# Patient Record
Sex: Female | Born: 1990 | Race: Black or African American | Hispanic: No | Marital: Single | Smoking: Never smoker
Health system: Southern US, Community
[De-identification: ages and names within clinical notes are randomized; demographics above are authoritative.]

## PROBLEM LIST (undated history)

## (undated) HISTORY — PX: WISDOM TOOTH EXTRACTION: SHX21

---
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
## 2010-01-31 ENCOUNTER — Ambulatory Visit: Payer: Self-pay | Admitting: Family Medicine

## 2010-01-31 ENCOUNTER — Encounter: Payer: Self-pay | Admitting: Family Medicine

## 2011-01-17 NOTE — Letter (Signed)
Summary: [facility] SPORT [facility]AM [facility]  [facility]   Imported By: Carin Primrose 02/01/2010 08:38:16  _____________________________________________________________________  External Attachment:    Type:   Image     Comment:   External Document

## 2011-01-17 NOTE — Assessment & Plan Note (Signed)
Summary: SPORTS PHYSICAL / LFW   Vital Signs:  Patient profile:   20 year old female Height:      64.25 inches Weight:      117.25 pounds BMI:     20.04 Temp:     97.4 degrees F oral Pulse rate:   68 / minute Pulse rhythm:   regular BP sitting:   104 / 68  (left arm) Cuff size:   regular  Vitals Entered By: Lewanda Rife LPN (January 31, 2010 12:57 PM) CC: sports exam for soccer  Vision Screening:Left eye with correction: 20 / 25 Right eye with correction: 20 / 20 Both eyes with correction: 20 / 20        Vision Entered By: Lewanda Rife LPN (January 31, 2010 12:58 PM)   History of Present Illness: starting school soccer soon  in good shape - playing indoor soccer all winter   is senior in [employer]   - and is grad soon  was accepted into several colleges   in past injured both wrists - broken both separately  foot fx years ago- all healed up with no problems   no sudden death t in family and no hx of heart M  Allergies (verified): No Known Drug Allergies  Review of Systems General:  Denies fatigue, fever, loss of appetite, and malaise. Eyes:  Denies blurring, discharge, double vision, and eye irritation. CV:  Denies chest pain or discomfort, leg cramps with exertion, lightheadness, near fainting, palpitations, and shortness of breath with exertion. Resp:  Denies cough, pleuritic, shortness of breath, and wheezing. GI:  Denies abdominal pain, bloody stools, change in bowel habits, and indigestion. MS:  Denies joint pain, joint redness, joint swelling, cramps, muscle weakness, and stiffness. Derm:  Denies lesion(s), poor wound healing, and rash. Neuro:  Denies numbness and tingling. Endo:  Denies excessive thirst. Heme:  Denies abnormal bruising and bleeding.  Physical Exam  General:  well developed, well nourished, in no acute distress Head:  normocephalic, atraumatic, and no abnormalities observed.   Eyes:  vision grossly intact, pupils equal, pupils round, and  pupils reactive to light.   Ears:  L ear normal.   Mouth:  pharynx pink and moist.   Neck:   no cervical or supraclavicular lymphadenopathy  Lungs:  clear bilaterally to A & P Heart:  RRR without murmur Abdomen:  Bowel sounds positive,abdomen soft and non-tender without masses, organomegaly or hernias noted. Msk:  No deformity or scoliosis noted of thoracic or lumbar spine.   nl rom all limbs ped planus bunion medial R foot  Pulses:  R and L carotid,radial,femoral,dorsalis pedis and posterior tibial pulses are full and equal bilaterally Extremities:  No clubbing, cyanosis, edema, or deformity noted with normal full range of motion of all joints.   Neurologic:  sensation intact to light touch, gait normal, and DTRs symmetrical and normal.   Skin:  Intact without suspicious lesions or rashes Cervical Nodes:  No lymphadenopathy noted Psych:  normal affect, talkative and pleasant    Impression & Recommendations:  Problem # 1:  ATHLETIC PHYSICAL, NORMAL (ICD-V70.3) Assessment Comment Only  no restrictions disc athletic prep and safety/ imp of hydration and balanced diet with good calcium intake  disc school/peer issues and upcoming college  Orders: Sports Physical (Sport)  Patient Instructions: 1)  keep a balanced diet  2)  good stretching and hydration to avoid injury 3)  no restricitons for sports   Prior Medications (reviewed today): None Current Allergies (reviewed today): No  known allergies

## 2012-04-29 ENCOUNTER — Encounter: Payer: Self-pay | Admitting: Obstetrics and Gynecology

## 2012-04-29 ENCOUNTER — Ambulatory Visit (INDEPENDENT_AMBULATORY_CARE_PROVIDER_SITE_OTHER): Payer: BC Managed Care – HMO | Admitting: Obstetrics and Gynecology

## 2012-04-29 VITALS — BP 100/64 | Temp 98.7°F | Ht 65.5 in | Wt 122.0 lb

## 2012-04-29 DIAGNOSIS — Z01419 Encounter for gynecological examination (general) (routine) without abnormal findings: Secondary | ICD-10-CM

## 2012-04-29 MED ORDER — NORETHIN ACE-ETH ESTRAD-FE 1-20 MG-MCG PO TABS: 1.0000 | ORAL_TABLET | Freq: Every day | ORAL | Status: DC

## 2012-04-29 NOTE — Progress Notes (Signed)
Last Pap: None WNL: No Regular Periods:no Contraception: Pill  Monthly Breast exam:no Tetanus<4yrs:yes Nl.Bladder Function:Yes Daily BMs:yes Healthy Diet:yes Calcium:no Mammogram:no Exercise:yes Seatbelt: yes Abuse at home: no Stressful work:no Sigmoid-colonoscopy: No Bone Density: No

## 2012-04-29 NOTE — Progress Notes (Signed)
Subjective:    Diane Prince is a 21 y.o. female, G0P0, who presents for an annual exam. The patient reports no complaints.  Menstrual cycle:   LMP: Patient's last menstrual period was 04/01/2012.           flow is light and Cycle is monthly with normal flow                                  and without intermenstrual bleeding or severe dysmenorrhea  Review of Systems Pertinent items are noted in HPI. Denies pelvic pain, uti symptoms, vaginitis symptoms, irregular bleeding, menopausal symptoms, change in bowel habits or rectal bleeding   Objective:    BP 100/64  Temp 98.7 F (37.1 C)  Ht 5' 5.5" (1.664 m)  Wt 122 lb (55.339 kg)  BMI 19.99 kg/m2  LMP 04/01/2012   :  Wt Readings from Last 1 Encounters:  04/29/12 122 lb (55.339 kg)   B: Body mass index is 19.99 kg/(m^2). General Appearance: Alert, appropriate appearance for age. No acute distress HEENT: Grossly normal Neck / Thyroid: Supple, no thyromegaly or cervical adenopathy Lungs: clear to auscultation bilaterally Back: No CVA tenderness Breast Exam: No masses or nodes.No dimpling, nipple retraction or discharge. Taught  self breast exam Cardiovascular: Regular rate and rhythm.  Gastrointestinal: Soft, non-tender, no masses or organomegaly Pelvic Exam: EGBUS-wnl, uterus appears normal size shape and consistency, adnexae-no masses or tenderness Lymphatic Exam: Non-palpable nodes in neck, clavicular,  axillary, or inguinal regions  Skin: no rashes or abnormalities Extremities: no clubbing cyanosis or edema  Neurologic: grossly normal Psychiatric: Alert and oriented, appropriate affect.    Assessment:   Routine GYN Exam Contraceptive Refill Plan:  Junel 1/20 #1  1 po qd 11 refills BCP instructions  Remijio Holleran,ELMIRAPA-C

## 2012-07-31 ENCOUNTER — Ambulatory Visit (INDEPENDENT_AMBULATORY_CARE_PROVIDER_SITE_OTHER): Payer: BC Managed Care – HMO | Admitting: Family Medicine

## 2012-07-31 ENCOUNTER — Encounter: Payer: Self-pay | Admitting: Family Medicine

## 2012-07-31 VITALS — BP 100/70 | HR 90 | Temp 97.7°F | Ht 65.5 in | Wt 127.5 lb

## 2012-07-31 DIAGNOSIS — R300 Dysuria: Secondary | ICD-10-CM

## 2012-07-31 LAB — POCT URINALYSIS DIPSTICK
Ketones, UA: NEGATIVE
Leukocytes, UA: NEGATIVE
Nitrite, UA: NEGATIVE
pH, UA: 7

## 2012-07-31 MED ORDER — SULFAMETHOXAZOLE-TRIMETHOPRIM 800-160 MG PO TABS: 1.0000 | ORAL_TABLET | Freq: Two times a day (BID) | ORAL | Status: DC

## 2012-07-31 NOTE — Progress Notes (Signed)
   [facility] [address] [place] [address] Phone: [phone] Fax: [phone]  Date:  07/31/2012   Name:  Diane Prince   DOB:  [birth year]   MRN:  [id number] Gender: female  Age: 21 y.o.  PCP:  Kerby Nora, MD    Chief Complaint: Urinary Tract Infection  Patient presents with urgency, small amt blood in urine No vaginal discharge or external irritation.  No STD exposure. No abd pain, no flank pain. Feels like needs to go  No burning No pain Was some blood  LMP 1 1/2 weeks ago   ROS: GEN:  no fevers, chills. GI: No n/v/d, eating normally Otherwise, ROS is as per the HPI.  PHYSICAL EXAM  Blood pressure 100/70, pulse 90, temperature 97.7 F (36.5 C), temperature source Oral, height 5' 5.5" (1.664 m), weight 127 lb 8 oz (57.834 kg), SpO2 98.00%.  GEN: WDWN, A&Ox4,NAD. Non-toxic HEENT: Atraumatc, normocephalic. CV: RRR, No M/G/R PULM: CTA B, No wheezes, crackles, or rhonchi ABD: S, NT, ND, +BS, no rebound. No CVAT. No suprapubic tenderness. EXT: No c/c/e  A/P: UTI. Rx with ABX as below   Outpatient Encounter Prescriptions as of 07/31/2012  Medication Sig Dispense Refill  . norethindrone-ethinyl estradiol (JUNEL FE 1/20) 1-20 MG-MCG tablet Take 1 tablet by mouth daily.  1 Package  11  . sulfamethoxazole-trimethoprim (BACTRIM DS,SEPTRA DS) 800-160 MG per tablet Take 1 tablet by mouth 2 (two) times daily.  6 tablet  0

## 2012-08-02 ENCOUNTER — Inpatient Hospital Stay (HOSPITAL_COMMUNITY)
Admission: EM | Admit: 2012-08-02 | Discharge: 2012-08-04 | DRG: 541 | Disposition: A | Discharge status: Home / Self Care | Payer: BC Managed Care – PPO | Attending: Internal Medicine | Admitting: Internal Medicine

## 2012-08-02 ENCOUNTER — Emergency Department (HOSPITAL_COMMUNITY): Payer: BC Managed Care – PPO

## 2012-08-02 ENCOUNTER — Encounter (HOSPITAL_COMMUNITY): Payer: Self-pay | Admitting: Family Medicine

## 2012-08-02 ENCOUNTER — Telehealth: Payer: Self-pay

## 2012-08-02 ENCOUNTER — Inpatient Hospital Stay (HOSPITAL_COMMUNITY): Payer: BC Managed Care – PPO

## 2012-08-02 DIAGNOSIS — I369 Nonrheumatic tricuspid valve disorder, unspecified: Secondary | ICD-10-CM

## 2012-08-02 DIAGNOSIS — H00029 Hordeolum internum unspecified eye, unspecified eyelid: Secondary | ICD-10-CM

## 2012-08-02 DIAGNOSIS — N201 Calculus of ureter: Secondary | ICD-10-CM | POA: Diagnosis present

## 2012-08-02 DIAGNOSIS — N390 Urinary tract infection, site not specified: Secondary | ICD-10-CM | POA: Diagnosis present

## 2012-08-02 DIAGNOSIS — J9383 Other pneumothorax: Secondary | ICD-10-CM | POA: Diagnosis present

## 2012-08-02 DIAGNOSIS — R079 Chest pain, unspecified: Secondary | ICD-10-CM | POA: Diagnosis present

## 2012-08-02 DIAGNOSIS — M79609 Pain in unspecified limb: Secondary | ICD-10-CM

## 2012-08-02 DIAGNOSIS — J982 Interstitial emphysema: Principal | ICD-10-CM | POA: Diagnosis present

## 2012-08-02 DIAGNOSIS — N1330 Unspecified hydronephrosis: Secondary | ICD-10-CM | POA: Diagnosis present

## 2012-08-02 LAB — CBC
Hemoglobin: 14.2 g/dL (ref 12.0–15.0)
MCH: 30.4 pg (ref 26.0–34.0)
RBC: 4.67 MIL/uL (ref 3.87–5.11)

## 2012-08-02 LAB — POCT I-STAT, CHEM 8
BUN: 16 mg/dL (ref 6–23)
Creatinine, Ser: 0.9 mg/dL (ref 0.50–1.10)
Potassium: 3.5 mEq/L (ref 3.5–5.1)
Sodium: 139 mEq/L (ref 135–145)
TCO2: 20 mmol/L (ref 0–100)

## 2012-08-02 LAB — CARDIAC PANEL(CRET KIN+CKTOT+MB+TROPI)
CK, MB: 1.6 ng/mL (ref 0.3–4.0)
CK, MB: 1.5 ng/mL (ref 0.3–4.0)
Relative Index: INVALID (ref 0.0–2.5)
Total CK: 92 U/L (ref 7–177)
Troponin I: <0.3 ng/mL (ref <0.30)

## 2012-08-02 LAB — MRSA PCR SCREENING: MRSA by PCR: NEGATIVE

## 2012-08-02 LAB — HEPATIC FUNCTION PANEL
Indirect Bilirubin: 0.7 mg/dL (ref 0.3–0.9)
Total Protein: 7.1 g/dL (ref 6.0–8.3)

## 2012-08-02 LAB — RAPID URINE DRUG SCREEN, HOSP PERFORMED
Cocaine: NOT DETECTED
Opiates: NOT DETECTED

## 2012-08-02 LAB — TSH: TSH: 0.165 u[IU]/mL — ABNORMAL LOW (ref 0.350–4.500)

## 2012-08-02 MED ORDER — IOHEXOL 350 MG/ML SOLN
100.0000 mL | Freq: Once | INTRAVENOUS | Status: AC | PRN
  Administered 2012-08-02: 100 mL via INTRAVENOUS

## 2012-08-02 MED ORDER — BIOTENE DRY MOUTH MT LIQD
15.0000 mL | Freq: Two times a day (BID) | OROMUCOSAL | Status: DC
  Administered 2012-08-02 (×2): 15 mL via OROMUCOSAL

## 2012-08-02 MED ORDER — SODIUM CHLORIDE 0.9 % IJ SOLN
3.0000 mL | Freq: Two times a day (BID) | INTRAMUSCULAR | Status: DC
  Administered 2012-08-02 – 2012-08-03 (×2): 3 mL via INTRAVENOUS

## 2012-08-02 MED ORDER — SODIUM CHLORIDE 0.9 % IV SOLN
INTRAVENOUS | Status: DC
  Administered 2012-08-02: 07:00:00 via INTRAVENOUS

## 2012-08-02 MED ORDER — PIPERACILLIN-TAZOBACTAM 3.375 G IVPB
3.3750 g | Freq: Three times a day (TID) | INTRAVENOUS | Status: DC
  Administered 2012-08-02: 3.375 g via INTRAVENOUS
  Filled 2012-08-02 (×3): qty 50

## 2012-08-02 NOTE — Telephone Encounter (Signed)
Noted  

## 2012-08-02 NOTE — Progress Notes (Signed)
Patient was admitted few hours ago by my partner Dr. Pearson Grippe for incidental finding of pneumomediastinum on CT scan, was done for some atypical chest pain in the ER.  Pulmonary critical care has already been officially consulted they will be following the patient, I have also discussed the case with cardiothoracic surgeon on call Dr. Lavinia Sharps earlier this morning, recommends just monitoring the patient for 24 hours, if she is symptom-free to discharge her home. He has nothing to offer. He recommended no dietary restrictions whatsoever.  She is on Zosyn for her UTI and pneumomediastinum for now.

## 2012-08-02 NOTE — Consult Note (Signed)
Name: Diane Prince MRN: [id number] DOB: 03/07/1991    LOS: 0    History of Present Illness:  21 yo female with recent trip to [place], Just returned about 57month ago, has had recent uti diagnosis on 8/14,  tx with bactrim ds. Pt developed sharp chest pain with yawning or inhaling air too quickly and slight sob on . Pain radiated to her neck. Slight nausea. + cough on 8/15,  nonproductive. Denied fever, chills, abd pain, diarrhea, brbpr, black stool. No hx of trauma. No wretching. No hx of asthma. ER evaluation showed clear CXR, but CT chest obtained d/t positive d-dimer showed Pneumomediastinum which was the reason for our consult.    Cultures: 8/16 rapid strep screen: negative mrsa PCR 8/16>>>  Antibiotics: Zosyn 8/16 (?)>>>  Tests / Events: CT chest 8/16: . Pneumomediastinum of unknown etiology. No esophageal inflammatory changes. The patient has a history of recent air travel, raising the possibility of barotrauma.  2. Negative for pulmonary embolism or acute vascular abnormality. UDS 8/16>>> Esophagram 8/16>>>  History reviewed. No pertinent past medical history. Past Surgical History  Procedure Date  . Wisdom tooth extraction    Prior to Admission medications   Medication Sig Start Date End Date Taking? Authorizing Provider  ibuprofen (ADVIL,MOTRIN) 200 MG tablet Take 400 mg by mouth every 8 (eight) hours as needed. For pain   Yes Historical Provider, MD  norethindrone-ethinyl estradiol (JUNEL FE,GILDESS FE,LOESTRIN FE) 1-20 MG-MCG tablet Take 1 tablet by mouth daily.   Yes Historical Provider, MD  sulfamethoxazole-trimethoprim (BACTRIM DS,SEPTRA DS) 800-160 MG per tablet Take 1 tablet by mouth 2 (two) times daily. For 3 days. Started 07/31/12.   Yes Historical Provider, MD   Allergies No Known Allergies  Family History Family History  Problem Relation Age of Onset  . Diabetes Paternal Grandfather   . Cancer Maternal Grandfather     Social History  reports that she has never smoked. She has never used smokeless tobacco. She reports that she does not drink alcohol or use illicit drugs. Student at [employer].  Review Of Systems  11 points review of systems is negative with an exception of listed in HPI. Denies nausea, choking, retching, cough, trauma, inhalants, asthma or any pert positives.   Vital Signs: BP 107/58  Pulse 69  Temp 97.8 F (36.6 C) (Oral)  Resp 24  SpO2 100%  LMP 07/19/2012 Room air      . sodium chloride 75 mL/hr at 08/02/12 0705     Intake/Output Summary (Last 24 hours) at 08/02/12 0841 Last data filed at 08/02/12 0800  Gross per 24 hour  Intake  68.75 ml  Output      0 ml  Net  68.75 ml    Physical Examination: General:  Well appearing well developed 21 year old female in no acute distress. Reporting pain subsided substantially since earlier this am Neuro:  Awake and oriented HEENT:  , no JVD Cardiovascular:  rrr Lungs:  CTA Abdomen:  Non-tender Musculoskeletal:  intact Skin:  intact  Ventilator settings:    Labs and Imaging:   Lab 08/02/12 0244  NA 139  K 3.5  CL 107  CO2 --  BUN 16  CREATININE 0.90  GLUCOSE 90    Lab 08/02/12 0244 08/02/12 0235  HGB 14.6 14.2  HCT 43.0 40.4  WBC -- 13.9*  PLT -- 240    Assessment and Plan: Spontaneous Pneumomediastinum. No clear etiology. Denies asthma, coughing, nausea, vomiting, choking or drug  inhalation. Had recent air travel but this was over two weeks ago.  Plan Check UDS Check esophagram to r/o esophageal injury F/u am cxr Prn analgesia D/c zosyn if esophogram is negative If pain stable can be d/c in am Would limit heavy lifting and air travel for at least 4-6 weeks.    BABCOCK,PETE 08/02/2012, 8:41 AM  Reviewed above, examined pt, and agree with assessment/plan.  Minimal symptoms of chest discomfort, benign physical exam, and maintaining oxygenation on room air.  No clear explanation for pneumomediastinum.  Will check  UDS, esophogram and f/u CXR.  Agree with travel restrictions and avoiding any lifting for 6 weeks at minimum.  Updated mother at bedside about plan.  Coralyn Helling, MD [facility] Pulmonary/Critical Care 08/02/2012, 10:18 AM Pager:  [phone] After 3pm call: [phone]

## 2012-08-02 NOTE — Telephone Encounter (Signed)
Triage Record Num: [id number] Operator: Alphonsa Overall Patient Name: Diane Prince Call Date & Time: 08/02/2012 1:17:31AM Patient Phone: [phone] PCP: Hannah Beat Patient Gender: Female PCP Fax : [phone] Patient DOB: 12/24/1990 Practice Name: [facility] Reason for Call: Caller: [place]/Mother; PCP: Juleen China.; CB#: [phone]; Call regarding Medication reaction Mom calling about chest tightening. Onset 08/01/12 at 1200. Began new medication Sulfamethoxazole 07/31/12 for UTI x 4 doses-improving. Afebrile. Shortness of breath. Symptoms worsening. Coughing. Breathing symptoms worsening over last few hours. Care advice given. Mom aware pt needs ED evaluation now. [place]. Protocol(s) Used: Breathing Problems Recommended Outcome per Protocol: See ED Immediately Reason for Outcome: New or worsening breathing problems that have not been evaluated Care Advice: ~ Another adult should drive. Call EMS 911 if develop new onset or increasing confusion or lethargy; breathing problems continue to worsen or skin becomes bluish/gray; develops chest pain. ~ ~ IMMEDIATE ACTION Write down provider's name. List or place the following in a bag for transport with the patient: current prescription and/or nonprescription medications; alternative treatments, therapies and medications; and street drugs. ~ May have clear liquids (such as water, clear fruit juices without pulp, soda, tea or coffee without dairy or non-dairy creamer, clear broth or bouillon, oral hydration solution, or plain gelatin, fruit ices/popsicles, hard candy) but do not eat solid foods before being seen by your provider. ~ ~ Place person in a position of comfort and loosen tight clothing. 08/02/2012 1:30:06AM Page 1 of 1

## 2012-08-02 NOTE — H&P (Signed)
Diane Prince is an 21 y.o. female.    Pcp:  [facility]  Chief Complaint: chest pain HPI: 21 yo female with recent trip to [place],  Just returned about 39month ago, has had recent uti diagnosis on Wednesday, tx with bactrim ds.  Pt developed sscp "sharp" pain with yawning or inhaling air too quickly and slight sob.  Pain radiated to her neck. Slight nausea.  + cough yesterday, nonproductive.   Pt denies fever, chills, abd pain, diarrhea, brbpr, black stool. No hx of trauma. No wretching. No hx of asthma.    In ED, CXR was negative.  D-dimer + and CT scan chest showed evidence of pneumomediastinum. I have contacted critical care who asked that we admit the patient to Stepdown.    History reviewed. No pertinent past medical history.  Past Surgical History  Procedure Date  . Wisdom tooth extraction     Family History  Problem Relation Age of Onset  . Diabetes Paternal Grandfather   . Cancer Maternal Grandfather    Social History:  reports that she has never smoked. She has never used smokeless tobacco. She reports that she does not drink alcohol or use illicit drugs.  Allergies: No Known Allergies   (Not in a hospital admission)  Results for orders placed during the hospital encounter of 08/02/12 (from the past 48 hour(s))  RAPID STREP SCREEN     Status: Normal   Collection Time   08/02/12  2:16 AM      Component Value Range Comment   Streptococcus, Group A Screen (Direct) NEGATIVE  NEGATIVE   D-DIMER, QUANTITATIVE     Status: Abnormal   Collection Time   08/02/12  2:35 AM      Component Value Range Comment   D-Dimer, Quant 0.72 (*) 0.00 - 0.48 ug/mL-FEU   CBC     Status: Abnormal   Collection Time   08/02/12  2:35 AM      Component Value Range Comment   WBC 13.9 (*) 4.0 - 10.5 K/uL    RBC 4.67  3.87 - 5.11 MIL/uL    Hemoglobin 14.2  12.0 - 15.0 g/dL    HCT 40.9  81.1 - 91.4 %    MCV 86.5  78.0 - 100.0 fL    MCH 30.4  26.0 - 34.0 pg    MCHC 35.1  30.0 - 36.0  g/dL    RDW 78.2  95.6 - 21.3 %    Platelets 240  150 - 400 K/uL   POCT I-STAT, CHEM 8     Status: Normal   Collection Time   08/02/12  2:44 AM      Component Value Range Comment   Sodium 139  135 - 145 mEq/L    Potassium 3.5  3.5 - 5.1 mEq/L    Chloride 107  96 - 112 mEq/L    BUN 16  6 - 23 mg/dL    Creatinine, Ser 0.86  0.50 - 1.10 mg/dL    Glucose, Bld 90  70 - 99 mg/dL    Calcium, Ion 5.78  4.69 - 1.23 mmol/L    TCO2 20  0 - 100 mmol/L    Hemoglobin 14.6  12.0 - 15.0 g/dL    HCT 62.9  52.8 - 41.3 %    Dg Chest 2 View  08/02/2012  *RADIOLOGY REPORT*  Clinical Data: Short of breath.  CHEST - 2 VIEW  Comparison: None.  Findings:  Cardiopericardial silhouette within normal limits. Mediastinal contours normal.  Trachea midline.  No airspace disease or effusion. Monitoring leads are projected over the chest.  IMPRESSION: Negative two-view chest.  Original Report Authenticated By: Andreas Newport, M.D.   Ct Angio Chest Pe W/cm &/or Wo Cm  08/02/2012  *RADIOLOGY REPORT*  Clinical Data: Short of breath and pain with inspiration.  Elevated white blood cell count.  CT ANGIOGRAPHY CHEST  Technique:  Multidetector CT imaging of the chest using the standard protocol during bolus administration of intravenous contrast. Multiplanar reconstructed images including MIPs were obtained and reviewed to evaluate the vascular anatomy.  Contrast: OMNIPAQUE IOHEXOL 350 MG/ML SOLN  Comparison: Chest radiograph 08/02/2012.  Findings: Technically adequate study without pulmonary embolus. The heart appears normal.  Pneumomediastinum is present.  There is no pneumothorax.  Pneumomediastinum is diffusely distributed, both anterior and posterior.  No mediastinum is present around the thymus.  There is no definite esophageal thickening.  No pleural fluid.  Soft tissue emphysema extends into the neck.  Bones appear within normal limits.  IMPRESSION: 1.  Pneumomediastinum of unknown etiology.  No esophageal inflammatory  changes.  The patient has a history of recent air travel, raising the possibility of barotrauma. 2.  Negative for pulmonary embolism or acute vascular abnormality. 3. Critical Value/emergent results were called by telephone at the time of interpretation on 08/02/2012 at 1658 hours to OPITZ, BRIAN, who verbally acknowledged these results.  Original Report Authenticated By: Andreas Newport, M.D.    Review of Systems  Constitutional: Negative for fever, chills, weight loss, malaise/fatigue and diaphoresis.  HENT: Negative for hearing loss, ear pain, nosebleeds, congestion, neck pain, tinnitus and ear discharge.   Eyes: Negative for blurred vision, double vision, photophobia, pain, discharge and redness.  Respiratory: Positive for cough and shortness of breath. Negative for hemoptysis, sputum production, wheezing and stridor.   Cardiovascular: Positive for chest pain. Negative for palpitations, orthopnea, claudication and leg swelling.  Gastrointestinal: Positive for nausea. Negative for heartburn, vomiting, abdominal pain, diarrhea, constipation, blood in stool and melena.  Genitourinary: Negative for dysuria, urgency, frequency, hematuria and flank pain.  Musculoskeletal: Negative for myalgias, back pain, joint pain and falls.  Skin: Negative for itching and rash.  Neurological: Positive for headaches. Negative for dizziness, tingling, tremors, sensory change, speech change, focal weakness, seizures and loss of consciousness.  Endo/Heme/Allergies: Negative for environmental allergies and polydipsia. Does not bruise/bleed easily.  Psychiatric/Behavioral: Negative for depression, suicidal ideas, hallucinations, memory loss and substance abuse. The patient is not nervous/anxious and does not have insomnia.     Blood pressure 114/66, pulse 91, temperature 97.8 F (36.6 C), temperature source Oral, resp. rate 24, last menstrual period 07/19/2012, SpO2 100.00%. Physical Exam  Constitutional: She is  oriented to person, place, and time. She appears well-developed and well-nourished.  HENT:  Head: Normocephalic and atraumatic.  Mouth/Throat: No oropharyngeal exudate.  Eyes: Conjunctivae and EOM are normal. Pupils are equal, round, and reactive to light. Right eye exhibits no discharge. Left eye exhibits no discharge. No scleral icterus.  Neck: Normal range of motion. Neck supple. No JVD present. No tracheal deviation present. No thyromegaly present.  Cardiovascular: Normal rate, regular rhythm and normal heart sounds.  Exam reveals no gallop and no friction rub.   No murmur heard. Respiratory: Effort normal and breath sounds normal. No stridor. No respiratory distress. She has no wheezes. She has no rales. She exhibits no tenderness.       Slight decrease in bs in right upper lung  GI: Soft. Bowel sounds are normal. She exhibits  no distension and no mass. There is no tenderness. There is no rebound and no guarding.  Musculoskeletal: Normal range of motion. She exhibits no edema and no tenderness.  Lymphadenopathy:    She has no cervical adenopathy.  Neurological: She is alert and oriented to person, place, and time. She has normal reflexes. She displays normal reflexes. No cranial nerve deficit. She exhibits normal muscle tone.  Skin: Skin is warm and dry. No rash noted. No erythema. No pallor.       No crepitus, over neck or chest, though the right side of neck appears slightly fuller than the left.      Assessment/Plan Cp likely secondary to pneumomediastinum Cycle cardiac markers Check echo  Pneumomediastium Nothing to suggest esophageal pathology such as wretching Pulm/critical care consulted Zosyn empirically   Uti: zosyn DVT prophylaxis: scd   Pearson Grippe 08/02/2012, 5:54 AM

## 2012-08-02 NOTE — Progress Notes (Signed)
  Echocardiogram 2D Echocardiogram has been performed.  Amauria Younts 08/02/2012, 9:12 AM

## 2012-08-02 NOTE — ED Provider Notes (Signed)
History     CSN: [id number]  Arrival date & time 08/02/12  0156   First MD Initiated Contact with Patient 08/02/12 0209      Chief Complaint  Patient presents with  . Shortness of Breath    (Consider location/radiation/quality/duration/timing/severity/associated sxs/prior treatment) HPI History provided by patient. Chest pain or shortness of breath the last 24 hours. Pain is substernal and sharp in quality. Radiates to back. Worse with deep breath. Some dry cough. No fevers. Some sore throat. History of strep throat. No rashes. No recent travel. No leg swelling. No leg pain. Is taking Bactrim for UTI prescribed by primary care physician. No further UTI symptoms. No history of DVT or PE. No family history of same. Takes birth control pills. No past medical history on file.  No past surgical history on file.  Family History  Problem Relation Age of Onset  . Diabetes Paternal Grandfather   . Cancer Maternal Grandfather     History  Substance Use Topics  . Smoking status: Never Smoker   . Smokeless tobacco: Never Used  . Alcohol Use: Not on file     rarely    OB History    Grav Para Term Preterm Abortions TAB SAB Ect Mult Living   0 0        0      Review of Systems  Constitutional: Negative for fever and chills.  HENT: Negative for neck pain and neck stiffness.   Eyes: Negative for pain.  Respiratory: Positive for cough and shortness of breath.   Cardiovascular: Positive for chest pain.  Gastrointestinal: Negative for abdominal pain.  Genitourinary: Negative for dysuria.  Musculoskeletal: Negative for back pain.  Skin: Negative for rash.  Neurological: Negative for headaches.  All other systems reviewed and are negative.    Allergies  Review of patient's allergies indicates no known allergies.  Home Medications   Current Outpatient Rx  Name Route Sig Dispense Refill  . NORETHIN ACE-ETH ESTRAD-FE 1-20 MG-MCG PO TABS Oral Take 1 tablet by mouth daily. 1  Package 11  . SULFAMETHOXAZOLE-TRIMETHOPRIM 800-160 MG PO TABS Oral Take 1 tablet by mouth 2 (two) times daily. 6 tablet 0    BP 109/66  Pulse 91  Temp 98 F (36.7 C) (Oral)  Resp 24  SpO2 100%  Physical Exam  Constitutional: She is oriented to person, place, and time. She appears well-developed and well-nourished.  HENT:  Head: Normocephalic and atraumatic.       Bilateral enlarged tonsils with uvula midline and mild erythema. No exudate.  Eyes: Conjunctivae and EOM are normal. Pupils are equal, round, and reactive to light.  Neck: Trachea normal. Neck supple. No thyromegaly present.  Cardiovascular: Normal rate, regular rhythm, S1 normal, S2 normal and normal pulses.     No systolic murmur is present   No diastolic murmur is present  Pulses:      Radial pulses are 2+ on the right side, and 2+ on the left side.  Pulmonary/Chest: Effort normal and breath sounds normal. She has no wheezes. She has no rhonchi. She has no rales. She exhibits no tenderness.  Abdominal: Soft. Normal appearance and bowel sounds are normal. There is no tenderness. There is no CVA tenderness and negative Murphy's sign.  Musculoskeletal:       BLE:s Calves nontender, no cords or erythema, negative Homans sign  Neurological: She is alert and oriented to person, place, and time. She has normal strength. No cranial nerve deficit or sensory deficit. GCS  eye subscore is 4. GCS verbal subscore is 5. GCS motor subscore is 6.  Skin: Skin is warm and dry. No rash noted. She is not diaphoretic.  Psychiatric: Her speech is normal.       Cooperative and appropriate    ED Course  Procedures (including critical care time)  Results for orders placed during the hospital encounter of 08/02/12  RAPID STREP SCREEN      Component Value Range   Streptococcus, Group A Screen (Direct) NEGATIVE  NEGATIVE  D-DIMER, QUANTITATIVE      Component Value Range   D-Dimer, Quant 0.72 (*) 0.00 - 0.48 ug/mL-FEU  CBC      Component  Value Range   WBC 13.9 (*) 4.0 - 10.5 K/uL   RBC 4.67  3.87 - 5.11 MIL/uL   Hemoglobin 14.2  12.0 - 15.0 g/dL   HCT 46.9  62.9 - 52.8 %   MCV 86.5  78.0 - 100.0 fL   MCH 30.4  26.0 - 34.0 pg   MCHC 35.1  30.0 - 36.0 g/dL   RDW 41.3  24.4 - 01.0 %   Platelets 240  150 - 400 K/uL  POCT I-STAT, CHEM 8      Component Value Range   Sodium 139  135 - 145 mEq/L   Potassium 3.5  3.5 - 5.1 mEq/L   Chloride 107  96 - 112 mEq/L   BUN 16  6 - 23 mg/dL   Creatinine, Ser 2.72  0.50 - 1.10 mg/dL   Glucose, Bld 90  70 - 99 mg/dL   Calcium, Ion 5.36  6.44 - 1.23 mmol/L   TCO2 20  0 - 100 mmol/L   Hemoglobin 14.6  12.0 - 15.0 g/dL   HCT 03.4  74.2 - 59.5 %   Dg Chest 2 View  08/02/2012  *RADIOLOGY REPORT*  Clinical Data: Short of breath.  CHEST - 2 VIEW  Comparison: None.  Findings:  Cardiopericardial silhouette within normal limits. Mediastinal contours normal. Trachea midline.  No airspace disease or effusion. Monitoring leads are projected over the chest.  IMPRESSION: Negative two-view chest.  Original Report Authenticated By: Andreas Newport, M.D.   Ct Angio Chest Pe W/cm &/or Wo Cm  08/02/2012  *RADIOLOGY REPORT*  Clinical Data: Short of breath and pain with inspiration.  Elevated white blood cell count.  CT ANGIOGRAPHY CHEST  Technique:  Multidetector CT imaging of the chest using the standard protocol during bolus administration of intravenous contrast. Multiplanar reconstructed images including MIPs were obtained and reviewed to evaluate the vascular anatomy.  Contrast: OMNIPAQUE IOHEXOL 350 MG/ML SOLN  Comparison: Chest radiograph 08/02/2012.  Findings: Technically adequate study without pulmonary embolus. The heart appears normal.  Pneumomediastinum is present.  There is no pneumothorax.  Pneumomediastinum is diffusely distributed, both anterior and posterior.  No mediastinum is present around the thymus.  There is no definite esophageal thickening.  No pleural fluid.  Soft tissue emphysema  extends into the neck.  Bones appear within normal limits.  IMPRESSION: 1.  Pneumomediastinum of unknown etiology.  No esophageal inflammatory changes.  The patient has a history of recent air travel, raising the possibility of barotrauma. 2.  Negative for pulmonary embolism or acute vascular abnormality. 3. Critical Value/emergent results were called by telephone at the time of interpretation on 08/02/2012 at 1658 hours to Telecia Larocque, who verbally acknowledged these results.  Original Report Authenticated By: Andreas Newport, M.D.   Cardiac monitoring and serial assessments  Pulse ox room air 100%  is adequate   Date: 08/02/2012  Rate: 82  Rhythm: normal sinus rhythm  QRS Axis: normal  Intervals: normal  ST/T Wave abnormalities: nonspecific ST changes  Conduction Disutrbances:none  Narrative Interpretation:   Old EKG Reviewed: none available  5:43 AM d/w Dr Pixie Casino who agrees to admit. VS remain WNL and PT continues to decline pain medications.   MDM   ECG, labs and imaging as above. Nursing notes and VS reviewed. MED admit.         Sunnie Nielsen, MD 08/02/12 [phone]

## 2012-08-02 NOTE — Significant Event (Signed)
Swallow eval negative.  Clinically ok for transfer to med/surg Will d/c zosyn.  Anders Simmonds ACNP-BC [facility] Pulmonary/Critical Care Pager # [phone] OR # [phone] if no answer

## 2012-08-02 NOTE — ED Notes (Addendum)
Patient states she started having shortness of breath since Thursday afternoon. Started on antibiotic on Wednesday and started having shortness of breath after 3rd dose. Denies rash. Patient also reports sharp pain with inspiration.

## 2012-08-03 ENCOUNTER — Inpatient Hospital Stay (HOSPITAL_COMMUNITY): Payer: BC Managed Care – PPO

## 2012-08-03 ENCOUNTER — Encounter (HOSPITAL_COMMUNITY): Payer: Self-pay | Admitting: Radiology

## 2012-08-03 DIAGNOSIS — H00029 Hordeolum internum unspecified eye, unspecified eyelid: Secondary | ICD-10-CM

## 2012-08-03 LAB — LIPASE, BLOOD: Lipase: 40 U/L (ref 11–59)

## 2012-08-03 LAB — CBC
Hemoglobin: 13.8 g/dL (ref 12.0–15.0)
MCH: 29.7 pg (ref 26.0–34.0)
MCHC: 33.9 g/dL (ref 30.0–36.0)

## 2012-08-03 LAB — URINALYSIS, ROUTINE W REFLEX MICROSCOPIC
Glucose, UA: NEGATIVE mg/dL
Specific Gravity, Urine: 1.028 (ref 1.005–1.030)
Urobilinogen, UA: 1 mg/dL (ref 0.0–1.0)
pH: 6 (ref 5.0–8.0)

## 2012-08-03 LAB — URINE MICROSCOPIC-ADD ON

## 2012-08-03 LAB — COMPREHENSIVE METABOLIC PANEL
ALT: 10 U/L (ref 0–35)
Calcium: 9.3 mg/dL (ref 8.4–10.5)
GFR calc Af Amer: >90 mL/min (ref >90)
Glucose, Bld: 128 mg/dL — ABNORMAL HIGH (ref 70–99)
Sodium: 135 mEq/L (ref 135–145)
Total Protein: 7.3 g/dL (ref 6.0–8.3)

## 2012-08-03 MED ORDER — SODIUM CHLORIDE 0.9 % IV SOLN
INTRAVENOUS | Status: AC
  Administered 2012-08-03: 12:00:00 via INTRAVENOUS

## 2012-08-03 MED ORDER — KETOROLAC TROMETHAMINE 15 MG/ML IJ SOLN
15.0000 mg | Freq: Three times a day (TID) | INTRAMUSCULAR | Status: DC | PRN
  Administered 2012-08-03: 15 mg via INTRAVENOUS
  Filled 2012-08-03: qty 1

## 2012-08-03 MED ORDER — PROMETHAZINE HCL 25 MG/ML IJ SOLN
25.0000 mg | Freq: Once | INTRAMUSCULAR | Status: AC
  Administered 2012-08-03: 25 mg via INTRAVENOUS
  Filled 2012-08-03: qty 1

## 2012-08-03 MED ORDER — ONDANSETRON HCL 4 MG/2ML IJ SOLN
4.0000 mg | Freq: Four times a day (QID) | INTRAMUSCULAR | Status: DC | PRN
  Filled 2012-08-03: qty 2

## 2012-08-03 MED ORDER — LEVOFLOXACIN IN D5W 500 MG/100ML IV SOLN
500.0000 mg | INTRAVENOUS | Status: DC
  Administered 2012-08-03: 500 mg via INTRAVENOUS
  Filled 2012-08-03 (×2): qty 100

## 2012-08-03 MED ORDER — CEFAZOLIN SODIUM-DEXTROSE 2-3 GM-% IV SOLR: 2.0000 g | INTRAVENOUS | Status: DC

## 2012-08-03 MED ORDER — KETOROLAC TROMETHAMINE 15 MG/ML IJ SOLN: 15.0000 mg | Freq: Four times a day (QID) | INTRAMUSCULAR | Status: DC | PRN

## 2012-08-03 MED ORDER — MORPHINE SULFATE 2 MG/ML IJ SOLN: 2.0000 mg | INTRAMUSCULAR | Status: DC | PRN

## 2012-08-03 MED ORDER — MORPHINE SULFATE 2 MG/ML IJ SOLN
2.0000 mg | INTRAMUSCULAR | Status: DC | PRN
  Filled 2012-08-03: qty 1

## 2012-08-03 MED ORDER — SODIUM CHLORIDE 0.9 % IV SOLN: INTRAVENOUS | Status: DC

## 2012-08-03 MED ORDER — SODIUM CHLORIDE 0.9 % IV SOLN
INTRAVENOUS | Status: DC
Start: 2012-08-03 — End: 2012-08-03
  Administered 2012-08-03: 13:00:00 via INTRAVENOUS

## 2012-08-03 MED ORDER — SODIUM CHLORIDE 0.9 % IV SOLN
INTRAVENOUS | Status: DC
  Administered 2012-08-03: 13:00:00 via INTRAVENOUS

## 2012-08-03 NOTE — Progress Notes (Addendum)
[facility]                                                                                Patient Demographics  Diane Prince, is a 21 y.o. female  ZOX:[id number]  WJX:[id number]  DOB - [birth year]  Admit date - 08/02/2012  Admitting Physician Massie Maroon, MD  Outpatient Primary MD for the patient is Kerby Nora, MD  LOS - 1   Chief Complaint  Patient presents with  . Shortness of Breath        Assessment & Plan    1. Ediopathic pneumomediastinum of unclear etiology - stable repeat chest x-ray stable his aphasia gram, tolerating oral diet okay to clear for home discharge per pulmonary critical care and cardiothoracic surgery.   2. Patient was being discharged, however soon after I saw the patient this morning and before she was being formally discharged she started complaining of intense right lower quadrant abdominal pain radiating to her flank, patient did have some blood in her urine at PCP office a few days ago, will check stat UA with culture, CBC CMP and lipase, have discussed the case with the radiologist on call Dr. Manson Passey best modality will be a noncontrast CT abdomen pelvis to look for stone and possible appendicitis/colitis. Also requested Dr. Ezzard Standing general surgery to see the patient. Will initiate IV fluids and monitor closely.   Addendum 11-22 am -  CT scan results are back consistent with right Uretric 3 mm renal stone with mild hydronephrosis-place the patient on IV Levaquin, IV fluid bolus followed by normal saline infusion, in control with IV narcotics and Zofran when necessary for nausea, have informed urologist on call Dr. Margarita Grizzle to see the patient.    Code Status: Will  Family Communication: Mother  Disposition Plan: Home     Consults  PCCM, cardiothoracic surgery Dr. Laneta Simmers over the phone, general surgery Dr. Ezzard Standing requested to see the patient today   Antibiotics  Zosyn x 1  yesterday   Time Spent in minutes   35   DVT  Prophylaxis SCDs    Leroy Sea M.D on 08/03/2012 at 11:22 AM  Between 7am to 7pm - Pager - [phone]  After 7pm go to [email or web address] - password [email or web address]  And look for the night coverage person covering for me after hours  [facility] Office  [phone]    Subjective:   Diane Prince today has, No headache, No chest pain, No abdominal pain - No Nausea, No new weakness tingling or numbness, No Cough - SOB.   Objective:   Filed Vitals:   08/02/12 1200 08/02/12 1500 08/02/12 1650 08/03/12 0500  BP: 94/50 90/57 104/53 102/55  Pulse: 66 69 66 72  Temp: 98.6 F (37 C)  98.3 F (36.8 C) 98.2 F (36.8 C)  TempSrc: Oral  Oral Oral  Resp: 24 19 20 20   Height:   5\' 6"  (1.676 m) 5\' 6"  (1.676 m)  Weight:   57.7 kg (127 lb 3.3 oz) 57.8 kg (127 lb 6.8 oz)  SpO2: 99% 98% 98% 98%    Wt Readings from Last 3 Encounters:  08/03/12 57.8 kg (127 lb 6.8 oz)  07/31/12 57.834  kg (127 lb 8 oz)  04/29/12 55.339 kg (122 lb)     Intake/Output Summary (Last 24 hours) at 08/03/12 1122 Last data filed at 08/02/12 1200  Gross per 24 hour  Intake     75 ml  Output      0 ml  Net     75 ml    Exam Awake Alert, Oriented X 3, No new F.N deficits, Normal affect .AT,PERRAL Supple Neck,No JVD, No cervical lymphadenopathy appriciated.  Symmetrical Chest wall movement, Good air movement bilaterally, CTAB RRR,No Gallops,Rubs or new Murmurs, No Parasternal Heave +ve B.Sounds, Abd Soft, Non tender, No organomegaly appriciated, No rebound - guarding or rigidity. No Cyanosis, Clubbing or edema, No new Rash or bruise    Data Review   CBC  Lab 08/03/12 0944 08/02/12 0244 08/02/12 0235  WBC 7.8 -- 13.9*  HGB 13.8 14.6 14.2  HCT 40.7 43.0 40.4  PLT 215 -- 240  MCV 87.5 -- 86.5  MCH 29.7 -- 30.4  MCHC 33.9 -- 35.1  RDW 14.2 -- 14.0  LYMPHSABS -- -- --  MONOABS -- -- --  EOSABS -- -- --  BASOSABS -- -- --  BANDABS -- -- --    Chemistries   Lab 08/03/12 0944 08/02/12  0605 08/02/12 0244  NA 135 -- 139  K 3.9 -- 3.5  CL 103 -- 107  CO2 23 -- --  GLUCOSE 128* -- 90  BUN 16 -- 16  CREATININE 0.87 -- 0.90  CALCIUM 9.3 -- --  MG -- -- --  AST 19 17 --  ALT 10 8 --  ALKPHOS 51 52 --  BILITOT 1.0 0.9 --   ------------------------------------------------------------------------------------------------------------------ estimated creatinine clearance is 94.1 ml/min (by C-G formula based on Cr of 0.87). ------------------------------------------------------------------------------------------------------------------ No results found for this basename: HGBA1C:2 in the last 72 hours ------------------------------------------------------------------------------------------------------------------ No results found for this basename: CHOL:2,HDL:2,LDLCALC:2,TRIG:2,CHOLHDL:2,LDLDIRECT:2 in the last 72 hours ------------------------------------------------------------------------------------------------------------------  [facility] 08/02/12 0605  TSH 0.165*  T4TOTAL --  T3FREE --  THYROIDAB --   ------------------------------------------------------------------------------------------------------------------ No results found for this basename: VITAMINB12:2,FOLATE:2,FERRITIN:2,TIBC:2,IRON:2,RETICCTPCT:2 in the last 72 hours  Coagulation profile No results found for this basename: INR:5,PROTIME:5 in the last 168 hours   Basename 08/02/12 0235  DDIMER 0.72*    Cardiac Enzymes  Lab 08/02/12 2217 08/02/12 1400 08/02/12 0604  CKMB 1.4 1.5 1.6  TROPONINI <0.30 <0.30 <0.30  MYOGLOBIN -- -- --   ------------------------------------------------------------------------------------------------------------------ No components found with this basename: POCBNP:3  Micro Results Recent Results (from the past 240 hour(s))  RAPID STREP SCREEN     Status: Normal   Collection Time   08/02/12  2:16 AM      Component Value Range Status Comment   Streptococcus, Group  A Screen (Direct) NEGATIVE  NEGATIVE Final   MRSA PCR SCREENING     Status: Normal   Collection Time   08/02/12  8:30 AM      Component Value Range Status Comment   MRSA by PCR NEGATIVE  NEGATIVE Final     Radiology Reports Dg Chest 2 View  08/03/2012  *RADIOLOGY REPORT*  Clinical Data: Follow up of pneumomediastinum.  CHEST - 2 VIEW  Comparison: 08/02/2012  Findings: Subtle pneumomediastinum within the anterior chest on the lateral view is improved since the prior plain film.  Not readily apparent on the frontal radiograph.  No subcutaneous air within the neck. Midline trachea.  Normal heart size.  No pleural effusion or pneumothorax.  Clear lungs.  Minimal S-shaped spinal curvature.  IMPRESSION: Improvement  in subtle pneumomediastinum.  No new process.  Original Report Authenticated By: Consuello Bossier, M.D.   Dg Chest 2 View  08/02/2012  *RADIOLOGY REPORT*  Clinical Data: Short of breath.  CHEST - 2 VIEW  Comparison: None.  Findings:  Cardiopericardial silhouette within normal limits. Mediastinal contours normal. Trachea midline.  No airspace disease or effusion. Monitoring leads are projected over the chest.  IMPRESSION: Negative two-view chest.  Original Report Authenticated By: Andreas Newport, M.D.   Ct Angio Chest Pe W/cm &/or Wo Cm  08/02/2012  *RADIOLOGY REPORT*  Clinical Data: Short of breath and pain with inspiration.  Elevated white blood cell count.  CT ANGIOGRAPHY CHEST  Technique:  Multidetector CT imaging of the chest using the standard protocol during bolus administration of intravenous contrast. Multiplanar reconstructed images including MIPs were obtained and reviewed to evaluate the vascular anatomy.  Contrast: OMNIPAQUE IOHEXOL 350 MG/ML SOLN  Comparison: Chest radiograph 08/02/2012.  Findings: Technically adequate study without pulmonary embolus. The heart appears normal.  Pneumomediastinum is present.  There is no pneumothorax.  Pneumomediastinum is diffusely distributed,  both anterior and posterior.  No mediastinum is present around the thymus.  There is no definite esophageal thickening.  No pleural fluid.  Soft tissue emphysema extends into the neck.  Bones appear within normal limits.  IMPRESSION: 1.  Pneumomediastinum of unknown etiology.  No esophageal inflammatory changes.  The patient has a history of recent air travel, raising the possibility of barotrauma. 2.  Negative for pulmonary embolism or acute vascular abnormality. 3. Critical Value/emergent results were called by telephone at the time of interpretation on 08/02/2012 at 1658 hours to OPITZ, BRIAN, who verbally acknowledged these results.  Original Report Authenticated By: Andreas Newport, M.D.   Dg Esophagus W/water Sol Cm  08/02/2012  *RADIOLOGY REPORT*  Clinical Data: Chest pain and shortness of breath. Pneumomediastinum.  ESOPHOGRAM/BARIUM SWALLOW  Technique:  Single contrast examination was performed using water- soluble gastrographin.  Fluoroscopy time:  1.0 minutes.  Comparison:  None.  Findings:  There is no evidence of leak or extravasation of contrast from the esophagus.  No evidence of esophageal stricture or mass.  No evidence of hiatal hernia.  Mild gastroesophageal reflux was seen to the level of the distal thoracic esophagus.  IMPRESSION:  1.  No evidence of esophageal leak or perforation. 2.  Mild gastroesophageal reflux noted.  Original Report Authenticated By: Danae Orleans, M.D.    Scheduled Meds:    . antiseptic oral rinse  15 mL Mouth Rinse BID  . levofloxacin (LEVAQUIN) IV  500 mg Intravenous Q24H  . sodium chloride  3 mL Intravenous Q12H  . DISCONTD: piperacillin-tazobactam (ZOSYN)  IV  3.375 g Intravenous Q8H   Continuous Infusions:    . sodium chloride    . sodium chloride    . DISCONTD: sodium chloride 75 mL/hr at 08/02/12 0705  . DISCONTD: sodium chloride    . DISCONTD: sodium chloride     PRN Meds:.ketorolac, morphine injection, ondansetron (ZOFRAN) IV

## 2012-08-03 NOTE — Consult Note (Signed)
Urology Consult  CC: right ureter stone  HPI: 21 year old female presented to the hospital with severe chest pain. Acute cardiopulmonary processes were ruled out. She developed acute right-sided flank pain this morning. This was associated with nausea. She also had a mild amount of hematuria. She denies fevers. This was sharp in nature. It was located in her right flank. It radiated to her ipsilateral abdomen. It is improved with IV narcotics. CT scan of the abdomen and pelvis reveals right-sided hydronephrosis with a very small right nonobstructing renal stone. There is a 2.5 mm right ureteral stone located at the right ureterovesical junction. UA is negative for signs of infection. She has not had any fevers.  Today we discussed the management of urinary stones. These options include observation, ureteroscopy, shockwave lithotripsy, and PCNL. We discussed which options are relevant to these particular stones. We discussed the natural history of stones as well as the complications of untreated stones and the impact on quality of life without treatment as well as with each of the above listed treatments. We also discussed the efficacy of each treatment in its ability to clear the stone burden. With any of these management options I discussed the signs and symptoms of infection and the need for emergent treatment should these be experienced. For each option we discussed the ability of each procedure to clear the patient of their stone burden. We also discussed risks, benefits, alternatives, and likelihood of achieving goals.  I recommend either observation or ureteroscopy. I do not feel that she will be able to pass the stone as she is having intractable pain. The patient would like to try to pass the stone but she is unable to she would like to schedule surgery for tomorrow.  PMH: History reviewed. No pertinent past medical history.  PSH: Past Surgical History  Procedure Date  . Wisdom tooth  extraction     Allergies: No Known Allergies  Medications: Prescriptions prior to admission  Medication Sig Dispense Refill  . ibuprofen (ADVIL,MOTRIN) 200 MG tablet Take 400 mg by mouth every 8 (eight) hours as needed. For pain      . norethindrone-ethinyl estradiol (JUNEL FE,GILDESS FE,LOESTRIN FE) 1-20 MG-MCG tablet Take 1 tablet by mouth daily.      Marland Kitchen sulfamethoxazole-trimethoprim (BACTRIM DS,SEPTRA DS) 800-160 MG per tablet Take 1 tablet by mouth 2 (two) times daily. For 3 days. Started 07/31/12.         Social History: History   Social History  . Marital Status: Single    Spouse Name: N/A    Number of Children: N/A  . Years of Education: N/A   Occupational History  . Not on file.   Social History Main Topics  . Smoking status: Never Smoker   . Smokeless tobacco: Never Used  . Alcohol Use: No     rarely  . Drug Use: No  . Sexually Active: Yes -- Female partner(s)    Birth Control/ Protection: Pill   Other Topics Concern  . Not on file   Social History Narrative  . No narrative on file    Family History: Family History  Problem Relation Age of Onset  . Diabetes Paternal Grandfather   . Cancer Maternal Grandfather     Review of Systems: Positive: Chest pain, nausea. Negative: Fever, changes in vision, weakness.  A further 10 point review of systems was negative except what is listed in the HPI.  Physical Exam:  General: No acute distress.  Awake. Head:  Normocephalic.  Atraumatic. ENT:  EOMI.  Mucous membranes moist Neck:  Supple.  No lymphadenopathy. CV:  S1 present. S2 present. Regular rate. Pulmonary: Equal effort bilaterally.  Clear to auscultation bilaterally. Abdomen: Soft.  Mildly tender to palpation RLQ. No rebound TTP. Skin:  Normal turgor.  No visible rash. Extremity: No gross deformity of bilateral upper extremities.  No gross deformity of    bilateral lower extremities. Neurologic: Alert. Appropriate mood.   Studies:  Recent Labs    [facility] 08/03/12 0944 08/02/12 0244 08/02/12 0235   HGB 13.8 14.6 --   WBC 7.8 -- 13.9*   PLT 215 -- 240    Recent Labs  Basename 08/03/12 0944 08/02/12 0244   NA 135 139   K 3.9 3.5   CL 103 107   CO2 23 --   BUN 16 16   CREATININE 0.87 0.90   CALCIUM 9.3 --   GFRNONAA >90 --   GFRAA >90 --     No results found for this basename: PT:2,INR:2,APTT:2 in the last 72 hours   No components found with this basename: ABG:2    Assessment:  Right ureteral vesicle junction stone.  Plan: -Resume regular diet today. -Strain urine. -Schedule for cystoscopy, right ureteroscopy, possible laser lithotripsy, possible right ureteral stent placement for tomorrow morning. -NPO at midnight.

## 2012-08-04 ENCOUNTER — Encounter (HOSPITAL_COMMUNITY)
Admission: EM | Disposition: A | Discharge status: Home / Self Care | Payer: Self-pay | Source: Home / Self Care | Attending: Internal Medicine

## 2012-08-04 ENCOUNTER — Inpatient Hospital Stay (HOSPITAL_COMMUNITY): Payer: BC Managed Care – PPO

## 2012-08-04 DIAGNOSIS — M79609 Pain in unspecified limb: Secondary | ICD-10-CM

## 2012-08-04 LAB — BASIC METABOLIC PANEL
CO2: 24 mEq/L (ref 19–32)
Calcium: 8.4 mg/dL (ref 8.4–10.5)
Chloride: 106 mEq/L (ref 96–112)
GFR calc Af Amer: >90 mL/min (ref >90)
Sodium: 137 mEq/L (ref 135–145)

## 2012-08-04 LAB — CBC
Platelets: 182 10*3/uL (ref 150–400)
RBC: 4.11 MIL/uL (ref 3.87–5.11)
RDW: 14.2 % (ref 11.5–15.5)
WBC: 5.5 10*3/uL (ref 4.0–10.5)

## 2012-08-04 LAB — URINE CULTURE: Colony Count: 6000

## 2012-08-04 SURGERY — CYSTOURETEROSCOPY, WITH RETROGRADE PYELOGRAM AND STENT INSERTION
Anesthesia: General | Laterality: Right

## 2012-08-04 MED ORDER — HYDROCODONE-ACETAMINOPHEN 5-500 MG PO TABS: 1.0000 | ORAL_TABLET | Freq: Four times a day (QID) | ORAL | Status: AC | PRN

## 2012-08-04 MED ORDER — TAMSULOSIN HCL 0.4 MG PO CAPS: 0.4000 mg | ORAL_CAPSULE | Freq: Every day | ORAL | Status: DC | PRN

## 2012-08-04 MED ORDER — DOCUSATE SODIUM 100 MG PO CAPS
200.0000 mg | ORAL_CAPSULE | Freq: Once | ORAL | Status: DC
  Filled 2012-08-04: qty 2

## 2012-08-04 MED ORDER — MAGNESIUM HYDROXIDE 400 MG/5ML PO SUSP: 30.0000 mL | Freq: Once | ORAL | Status: DC

## 2012-08-04 NOTE — Discharge Summary (Signed)
[facility]                                                                                   Diane Prince, is a 21 y.o. female  DOB 30-Jul-1991  MRN [id number].  Admission date:  08/02/2012  Discharge Date:  08/04/2012  Primary MD  Kerby Nora, MD  Admitting Physician  Massie Maroon, MD  Admission Diagnosis  Pneumomediastinum [518.1] chest pain right ureteral stone  Discharge Diagnosis     Active Problems:  Pneumomediastinum    History reviewed. No pertinent past medical history.  Past Surgical History  Procedure Date  . Wisdom tooth extraction        Discharge Diagnoses:   Active Problems:  Pneumomediastinum    Discharge Condition: Stable   Diet recommendation: See Discharge Instructions below   Consults , urology, cardiothoracic surgery Dr Laneta Simmers over the phone   History of present illness and  Hospital Course:  See H&P, Labs, Consult and Test reports for all details in brief, patient was admitted for mild nonspecific chest pain which was after a few episodes of retching and nausea most likely causing small ECG will level rupture versus pulmonary bleb rupture causing a very tiny pneumothorax seen on chest, case was discussed with cardiothoracic surgery and patient was seen by pulmonary, she had dysphagia gram which was stable without any leaks, she tolerated oral diet without any discomfort and was discharged yesterday, soon after her discharge patient started complaining of excruciating right lower quadrant abdominal pain which was found to be secondary to right ureteric stone, she was seen by urologist Dr. Margarita Grizzle and was scheduled for surgery this morning, however patient remained pain-free throughout yesterday afternoon evening and night and this morning did not want surgery, since she was pain-free Dr. Margarita Grizzle who I discussed the case with myself personally this morning was in that patient could be discharged home with outpatient urology  followup as clinically she has likely passed a stone, she will be given some pain medications and Flomax to be taken in flank pain reoccurs per urology recommendation, repeat KUB was inconclusive due to large stool burden i.e. stone was not visualized.  She is symptom free this morning in good spirits with stable labwork.      Today   Subjective:   Diane Prince today has no headache,no chest abdominal pain,no new weakness tingling or numbness, feels much better wants to go home today.    Objective:   Blood pressure 106/55, pulse 55, temperature 98.4 F (36.9 C), temperature source Oral, resp. rate 18, height 5\' 6"  (1.676 m), weight 57.8 kg (127 lb 6.8 oz), last menstrual period 07/19/2012, SpO2 100.00%.   Intake/Output Summary (Last 24 hours) at 08/04/12 0918 Last data filed at 08/04/12 0600  Gross per 24 hour  Intake    123 ml  Output    850 ml  Net   -727 ml    Exam Awake Alert, Oriented *3, No new F.N deficits, Normal affect [place].AT,PERRAL Supple Neck,No JVD, No cervical lymphadenopathy appriciated.  Symmetrical Chest wall movement, Good air movement bilaterally, CTAB RRR,No Gallops,Rubs or new Murmurs, No Parasternal Heave +ve B.Sounds, Abd Soft, Non  tender, No organomegaly appriciated, No rebound -guarding or rigidity. No Cyanosis, Clubbing or edema, No new Rash or bruise  Data Review      Ct Abdomen Pelvis Wo Contrast  08/03/2012  *RADIOLOGY REPORT*  Clinical Data: Right lower quadrant pain radiating into the back. Hematuria.  Recent esophagram.  Question of appendicitis versus stone.  CT ABDOMEN AND PELVIS WITHOUT CONTRAST  Technique:  Multidetector CT imaging of the abdomen and pelvis was performed following the standard protocol without intravenous contrast.  Comparison: CT of the chest 08/02/2012  Findings: Images of the lung bases are unremarkable.  The no evidence for pneumomediastinum at the lung bases.  No focal abnormality identified within the liver, spleen,  pancreas, adrenal glands.  There is mild right hydronephrosis and the density of the right kidney is less than that of the left.  Intrarenal calculus in the lower pole of the right kidney is 1 mm.  Although the ureters are difficult to follow, a probable right ureteral vesicle junction calculus is 3 mm in diameter.  The colon contains residual contrast following the recent esophagram.  The appendix is opacified and normal in caliber.  The stomach and small bowel loops are normal in appearance.  No evidence for colonic obstruction.  The uterus is present.  No evidence for adnexal mass.  There may be a trace amount free pelvic fluid.  No evidence for pelvic adenopathy. No evidence for aortic aneurysm. Visualized osseous structures have a normal appearance.  IMPRESSION:  1.  Probable 3 mm right ureteral vesicle junction calculus. 2.  Mild right hydronephrosis. 3.  Normal appendix.  Original Report Authenticated By: Patterson Hammersmith, M.D.   Dg Chest 2 View  08/03/2012  *RADIOLOGY REPORT*  Clinical Data: Follow up of pneumomediastinum.  CHEST - 2 VIEW  Comparison: 08/02/2012  Findings: Subtle pneumomediastinum within the anterior chest on the lateral view is improved since the prior plain film.  Not readily apparent on the frontal radiograph.  No subcutaneous air within the neck. Midline trachea.  Normal heart size.  No pleural effusion or pneumothorax.  Clear lungs.  Minimal S-shaped spinal curvature.  IMPRESSION: Improvement in subtle pneumomediastinum.  No new process.  Original Report Authenticated By: Consuello Bossier, M.D.   Dg Chest 2 View  08/02/2012  *RADIOLOGY REPORT*  Clinical Data: Short of breath.  CHEST - 2 VIEW  Comparison: None.  Findings:  Cardiopericardial silhouette within normal limits. Mediastinal contours normal. Trachea midline.  No airspace disease or effusion. Monitoring leads are projected over the chest.  IMPRESSION: Negative two-view chest.  Original Report Authenticated By: Andreas Newport, M.D.   Dg Abd 1 View  08/04/2012  *RADIOLOGY REPORT*  Clinical Data: Evaluate for passage of distal right ureteral stone. No current abdominal pain.  ABDOMEN - 1 VIEW  Comparison: CT of the abdomen and pelvis 08/03/2012  Findings: There is significant residual contrast in the colon following CT exam.  Scattered phleboliths are identified in the pelvis.  Distal right ureteral stone is not seen but would likely be obscured by contrast if present.  Regional bowel gas pattern is nonobstructive. Visualized osseous structures have a normal appearance.  IMPRESSION:  1.  No evidence for ureteral stone. 2.  Larina Bras would likely be obscured by contrast if present.  Original Report Authenticated By: Patterson Hammersmith, M.D.   Ct Angio Chest Pe W/cm &/or Wo Cm  08/02/2012  *RADIOLOGY REPORT*  Clinical Data: Short of breath and pain with inspiration.  Elevated white blood cell  count.  CT ANGIOGRAPHY CHEST  Technique:  Multidetector CT imaging of the chest using the standard protocol during bolus administration of intravenous contrast. Multiplanar reconstructed images including MIPs were obtained and reviewed to evaluate the vascular anatomy.  Contrast: OMNIPAQUE IOHEXOL 350 MG/ML SOLN  Comparison: Chest radiograph 08/02/2012.  Findings: Technically adequate study without pulmonary embolus. The heart appears normal.  Pneumomediastinum is present.  There is no pneumothorax.  Pneumomediastinum is diffusely distributed, both anterior and posterior.  No mediastinum is present around the thymus.  There is no definite esophageal thickening.  No pleural fluid.  Soft tissue emphysema extends into the neck.  Bones appear within normal limits.  IMPRESSION: 1.  Pneumomediastinum of unknown etiology.  No esophageal inflammatory changes.  The patient has a history of recent air travel, raising the possibility of barotrauma. 2.  Negative for pulmonary embolism or acute vascular abnormality. 3. Critical Value/emergent results  were called by telephone at the time of interpretation on 08/02/2012 at 1658 hours to OPITZ, BRIAN, who verbally acknowledged these results.  Original Report Authenticated By: Andreas Newport, M.D.   Dg Esophagus W/water Sol Cm  08/02/2012  *RADIOLOGY REPORT*  Clinical Data: Chest pain and shortness of breath. Pneumomediastinum.  ESOPHOGRAM/BARIUM SWALLOW  Technique:  Single contrast examination was performed using water- soluble gastrographin.  Fluoroscopy time:  1.0 minutes.  Comparison:  None.  Findings:  There is no evidence of leak or extravasation of contrast from the esophagus.  No evidence of esophageal stricture or mass.  No evidence of hiatal hernia.  Mild gastroesophageal reflux was seen to the level of the distal thoracic esophagus.  IMPRESSION:  1.  No evidence of esophageal leak or perforation. 2.  Mild gastroesophageal reflux noted.  Original Report Authenticated By: Danae Orleans, M.D.      Recent Results (from the past 240 hour(s))  RAPID STREP SCREEN     Status: Normal   Collection Time   08/02/12  2:16 AM      Component Value Range Status Comment   Streptococcus, Group A Screen (Direct) NEGATIVE  NEGATIVE Final   MRSA PCR SCREENING     Status: Normal   Collection Time   08/02/12  8:30 AM      Component Value Range Status Comment   MRSA by PCR NEGATIVE  NEGATIVE Final      CBC w Diff: Lab Results  Component Value Date   WBC 5.5 08/04/2012   HGB 12.4 08/04/2012   HCT 35.9* 08/04/2012   PLT 182 08/04/2012    CMP: Lab Results  Component Value Date   NA 137 08/04/2012   K 3.7 08/04/2012   CL 106 08/04/2012   CO2 24 08/04/2012   BUN 10 08/04/2012   CREATININE 0.73 08/04/2012   PROT 7.3 08/03/2012   ALBUMIN 3.8 08/03/2012   BILITOT 1.0 08/03/2012   ALKPHOS 51 08/03/2012   AST 19 08/03/2012   ALT 10 08/03/2012  .   Discharge Instructions     Follow with Primary MD Kerby Nora, MD in 3 days   Get CBC, CMP, UA, TSH, Free T3 and Free T4 checked 3 days by Primary MD and again as  instructed by your Primary MD. Get a 2 view Chest X ray done next visit.  Get Medicines reviewed and adjusted.  Please request your Prim.MD to go over all Hospital Tests and Procedure/Radiological results at the follow up, please get all Hospital records sent to your Prim MD by signing hospital release before you go  home.  Activity: As tolerated with Full fall precautions use walker/cane & assistance as needed   Diet:  Regular, keep yourself well hydrated.   Disposition Home    If you experience worsening of your admission symptoms, develop shortness of breath, life threatening emergency, suicidal or homicidal thoughts you must seek medical attention immediately by calling 911 or calling your MD immediately  if symptoms less severe.  You Must read complete instructions/literature along with all the possible adverse reactions/side effects for all the Medicines you take and that have been prescribed to you. Take any new Medicines after you have completely understood and accpet all the possible adverse reactions/side effects.   Do not drive if your were admitted for syncope or siezures until you have seen by Primary MD or a Neurologist and advised to drive.  Do not drive when taking Pain medications.    Do not take more than prescribed Pain, Sleep and Anxiety Medications  Special Instructions: If you have smoked or chewed Tobacco  in the last 2 yrs please stop smoking, stop any regular Alcohol  and or any Recreational drug use.  Wear Seat belts while driving.  Follow-up Information    Follow up with Milford Cage, MD. Schedule an appointment as soon as possible for a visit in 3 days.   Contact information:   [address] [facility] [place] [address] [phone]       Follow up with Kerby Nora, MD. Schedule an appointment as soon as possible for a visit in 3 days.   Contact information:   [address] [place] [address] [phone]            Discharge Medications   Medication List  As of 08/04/2012  9:18 AM   START taking these medications         HYDROcodone-acetaminophen 5-500 MG per tablet   Commonly known as: VICODIN   Take 1 tablet by mouth every 6 (six) hours as needed for pain (If flank pain reoccurs).      Tamsulosin HCl 0.4 MG Caps   Commonly known as: FLOMAX   Take 1 capsule (0.4 mg total) by mouth daily as needed (Take only if Flank pain reoccurs).         CONTINUE taking these medications         ibuprofen 200 MG tablet   Commonly known as: ADVIL,MOTRIN      norethindrone-ethinyl estradiol 1-20 MG-MCG tablet   Commonly known as: JUNEL FE,GILDESS FE,LOESTRIN FE         STOP taking these medications         sulfamethoxazole-trimethoprim 800-160 MG per tablet          Where to get your medications    These are the prescriptions that you need to pick up. We sent them to a specific pharmacy, so you will need to go there to get them.   [facility] - [place], [place] - [id number], [address]    [address], [place] [address]    Phone: [phone]        Tamsulosin HCl 0.4 MG Caps         You may get these medications from any pharmacy.         HYDROcodone-acetaminophen 5-500 MG per tablet  Total Time in preparing paper work, data evaluation and todays exam - 35 minutes  Leroy Sea M.D on 08/04/2012 at 9:18 AM  [facility] Office  [phone]

## 2012-08-04 NOTE — Progress Notes (Signed)
Gave pt discharge instructions and explained them to her. Pt verbalized understanding of instructions given. Pt left via wheelchair with tech in no acute distress. 

## 2012-08-04 NOTE — Progress Notes (Addendum)
Urology Progress Note  Subjective:     No acute urologic events overnight. No pain overnight night. Patient states she does not know if she passed her stone. She does not want to have surgery. We discussed the risks/benefits of a trial of passage vs surgery. I explained that it is possible for stones to become asymptomatic but cause blockage of the kidney. Blockage could cause permanent damage if left untreated. I advised obtaining imaging to ensure the stone is no longer present.  ROS: Negative: nausea  Objective:  Patient Vitals for the past 24 hrs:  BP Temp Temp src Pulse Resp SpO2  08/03/12 2313 90/53 mmHg 98.7 F (37.1 C) Oral 62  18  100 %    Physical Exam: General:  No acute distress, awake Cardiovascular:    [x]   S1/S2 present, RRR  []   Irregularly irregular Chest:  CTA-B Abdomen:               []  Soft, appropriately TTP  [x]  Soft, NTTP  []  Soft, appropriately TTP, incision(s) clean/dry/intact  Genitourinary: No catheter    I/O last 3 completed shifts: In: 491.8 [P.O.:120; I.V.:371.8] Out: 200 [Urine:200]  Recent Labs  Basename 08/03/12 0944 08/02/12 0244 08/02/12 0235   HGB 13.8 14.6 --   WBC 7.8 -- 13.9*   PLT 215 -- 240    Recent Labs  Basename 08/03/12 0944 08/02/12 0244   NA 135 139   K 3.9 3.5   CL 103 107   CO2 23 --   BUN 16 16   CREATININE 0.87 0.90   CALCIUM 9.3 --   GFRNONAA >90 --   GFRAA >90 --     No results found for this basename: PT:2,INR:2,APTT:2 in the last 72 hours   No components found with this basename: ABG:2    Length of stay: 2 days.  Assessment: Right ureter stone.  Plan: -Obtain KUB this morning. -Cancel surgery.   Natalia Leatherwood, MD [phone]   ADDENDUM:  Larina Bras not able to be visualized on KUB today and may be obscured by contrast. -Ok to discharge home. Continue to strain urine. -Follow up with me on 08/16/12 at 2:15 (arrive by 1:45) -Please discharge home with flomax once daily and Percocet prn.

## 2012-08-05 ENCOUNTER — Telehealth: Payer: Self-pay | Admitting: Family Medicine

## 2012-08-05 ENCOUNTER — Telehealth: Payer: Self-pay

## 2012-08-05 NOTE — Telephone Encounter (Signed)
Pt left v/m requesting urine culture results done 07/31/12.Please advise.

## 2012-08-05 NOTE — Telephone Encounter (Signed)
Called patient and discussed. Reviewed her d/c summary and imaging.  Pt had questions regarding her urine study 8/14. I reviewed findings with her. Lg Blood, SG 1.025, lg protein, N neg, LE neg. She asked several times about this, and we reviewed each time, and I reminded her of how we discussed in the office.  I also reviewed details of other aspects of her case, and it looks like she has a kidney stone. Initially, clinically felt to have UTI. Patient had questions regarding why stone not initially diagnosed. Discussed classic findings, and that patient essentially had no pain, abd or flank, and sx more c/w UTI, and blood only in urine. Discussed that stones in this picture in a healthy 21 year old female are unlikely.   Last urine culture reviewed 8/17 with patient.   8/14 culture was ordered and then inadvertantly cancelled, which I discussed with her.   We also discussed that with kidney stones, usually patients are also placed on antibiotics. She is currently at home pushing fluids, trying to pass stone, and taking some pain medication.    Hannah Beat, MD 08/05/2012, 1:23 PM

## 2012-08-05 NOTE — Telephone Encounter (Signed)
Discussed with patient on the phone earlier - see phone note.

## 2012-08-05 NOTE — Telephone Encounter (Signed)
Caller: [name]/Patient; Patient Name: Diane Prince; PCP: Juleen China.; Best Callback Phone Number: [phone].  Pt calling to get urinalysis results from last Wednesday, 07/31/12. Pt lab test viewed as abnormal.  There were not any notes found from the doctor.  RN instructed pt that a message would be sent back and someone would be calling to give her results.  ( RN did not give any results to pt)

## 2012-08-29 ENCOUNTER — Telehealth: Payer: Self-pay

## 2012-08-29 NOTE — Telephone Encounter (Signed)
Pt has not received info discussed with Diane Prince. Ermalinda Barrios said mailed the day she spoke with Diane Prince. Explained our mail goes to central mailing location in [place] and can take 4-7 for mail to be received. Ms Feiertag understood.

## 2013-06-23 ENCOUNTER — Telehealth: Payer: Self-pay | Admitting: Family Medicine

## 2013-06-23 MED ORDER — ATOVAQUONE-PROGUANIL HCL 250-100 MG PO TABS: 1.0000 | ORAL_TABLET | Freq: Every day | ORAL | Status: DC

## 2013-06-23 NOTE — Telephone Encounter (Signed)
Patient is going to [place], [place] tomorrow and needs Malaria pills.  She has taken them before when she went to [place].

## 2013-06-23 NOTE — Telephone Encounter (Signed)
Px written for call in  For malarone  Start it today  Make sure she is not pregnant

## 2013-06-23 NOTE — Telephone Encounter (Signed)
Pt's mother notified Rx sent into pharmacy and to make sure she isn't pregnant before starting Rx, pt's mother verbalized understanding

## 2013-07-25 ENCOUNTER — Telehealth: Payer: Self-pay | Admitting: Family Medicine

## 2013-07-25 NOTE — Telephone Encounter (Signed)
Patient Information:  Caller Name: [facility]  Phone: [phone]  Patient: Diane Prince, Diane Prince  Gender: Female  DOB: 13-Aug-1991  Age: 22 Years  PCP: [facility] [employer] [facility])  Pregnant: No  Office Follow Up:  Does the office need to follow up with this patient?: No  Instructions For The Office: N/A  RN Note:  Plans to go to [employer]  Symptoms  Reason For Call & Symptoms: Diarrhea and abd cramping started Wed am 8/6  as she was returning from trip to [place].  Abdominal crampiing wth 3-4 diarrhea stools yesterday 8/7 and 2 stools today 8/8.  Thinks may be giardia infection.  Reviewed Health History In EMR: Yes  Reviewed Medications In EMR: Yes  Reviewed Allergies In EMR: Yes  Reviewed Surgeries / Procedures: Yes  Date of Onset of Symptoms: 07/23/2013 OB / GYN:  LMP: 07/01/2013  Guideline(s) Used:  Diarrhea  Disposition Per Guideline:   Callback by PCP Today  Reason For Disposition Reached:   Travel to a foreign country in past month  Advice Given:  Fluids:  Drink more fluids, at least 8-10 glasses (8 oz or 240 ml) daily.  For example: sports drinks, diluted fruit juices, soft drinks.  Supplement this with saltine crackers or soups to make certain that you are getting sufficient fluid and salt to meet your body's needs.  Avoid caffeinated beverages (Reason: caffeine is mildly dehydrating).  Nutrition:  Maintaining some food intake during episodes of diarrhea is important.  Ideal initial foods include boiled starches/cereals (e.g., potatoes, rice, noodles, wheat, oats) with a small amount of salt to taste.  Other acceptable foods include: bananas, yogurt, crackers, soup.  As your stools return to normal consistency, resume a normal diet.  Call Back If:  You become worse.  Patient Will Follow Care Advice:  YES

## 2013-07-28 ENCOUNTER — Ambulatory Visit (INDEPENDENT_AMBULATORY_CARE_PROVIDER_SITE_OTHER): Payer: BC Managed Care – PPO | Admitting: Family Medicine

## 2013-07-28 ENCOUNTER — Encounter: Payer: Self-pay | Admitting: Family Medicine

## 2013-07-28 ENCOUNTER — Telehealth: Payer: Self-pay | Admitting: Family Medicine

## 2013-07-28 VITALS — BP 100/62 | HR 78 | Temp 98.4°F | Ht 65.5 in | Wt 122.5 lb

## 2013-07-28 DIAGNOSIS — R197 Diarrhea, unspecified: Secondary | ICD-10-CM | POA: Insufficient documentation

## 2013-07-28 DIAGNOSIS — Z789 Other specified health status: Secondary | ICD-10-CM

## 2013-07-28 NOTE — Assessment & Plan Note (Signed)
With hx of recent travel to [place]- and some improvement in the past several days Though this may be a straightforward traveler's diarrhea that is resolving - given area of travel will check for giardia and crypto If worse /abd pain or fever- pt will call and we will empirically treat Adv keeping up a good fluid intake and bland diet

## 2013-07-28 NOTE — Patient Instructions (Addendum)
We will do a test for giardia / cryptosporidium  If worse/fever/ abdominal pain or blood in stool please call  We will treat you based on result Keep up fluids and eat a bland diet

## 2013-07-28 NOTE — Telephone Encounter (Signed)
That is fine with me if ok with Dr Ermalene Searing

## 2013-07-28 NOTE — Telephone Encounter (Signed)
Patient would like to switch doctors from Dr. Ermalene Searing to Dr. Milinda Antis per parents request because they see Dr. Milinda Antis.  Please inform me of your wishes in this matter.

## 2013-07-28 NOTE — Progress Notes (Signed)
Subjective:    Patient ID: Diane Prince, female    DOB: [birth year], 22 y.o.   MRN: [id number]  HPI Just got back from [place] last week - she was volunteering in a preschool / really good experience Was in a very poor area for the most part- also had a chance to site see   Here with some GI symptoms  Did not get sick when she was there - until the day she flew back  Stomach was crampy/ no appetitie- just drank water and ate bread Last week some diarrhea/ gas and cramping Seems to be getting better now - no diarrhea since Saturday  She was concerned about Giardia   No fever  No blood in stool  No worms in stool   Appetite is fair - was excited to get back to [employer]  Does not feel like she is digesting well at all  Does better with oatmeal   Just finished malarone   Patient Active Problem List   Diagnosis Date Noted  . Pneumomediastinum 08/02/2012  . HORDEOLUM, INTERNAL 04/12/2009  . TOE PAIN 08/07/2007   No past medical history on file. Past Surgical History  Procedure Laterality Date  . Wisdom tooth extraction     History  Substance Use Topics  . Smoking status: Never Smoker   . Smokeless tobacco: Never Used  . Alcohol Use: No     Comment: rarely   Family History  Problem Relation Age of Onset  . Diabetes Paternal Grandfather   . Cancer Maternal Grandfather    No Known Allergies Current Outpatient Prescriptions on File Prior to Visit  Medication Sig Dispense Refill  . ibuprofen (ADVIL,MOTRIN) 200 MG tablet Take 400 mg by mouth every 8 (eight) hours as needed. For pain      . norethindrone-ethinyl estradiol (JUNEL FE,GILDESS FE,LOESTRIN FE) 1-20 MG-MCG tablet Take 1 tablet by mouth daily.       No current facility-administered medications on file prior to visit.    Review of Systems Review of Systems  Constitutional: Negative for fever, appetite change, fatigue and unexpected weight change. ENT neg for ST or congestion   Eyes: Negative for pain and  visual disturbance.  Respiratory: Negative for cough and shortness of breath.   Cardiovascular: Negative for cp or palpitations    Gastrointestinal: Negative for blood in stool/ dark stool/ vomiting .  Genitourinary: Negative for urgency and frequency.  Skin: Negative for pallor or rash   Neurological: Negative for weakness, light-headedness, numbness and headaches.  Hematological: Negative for adenopathy. Does not bruise/bleed easily.  Psychiatric/Behavioral: Negative for dysphoric mood. The patient is not nervous/anxious.         Objective:   Physical Exam  Constitutional: She appears well-developed and well-nourished. No distress.  HENT:  Head: Normocephalic and atraumatic.  Mouth/Throat: Oropharynx is clear and moist.  Eyes: Conjunctivae and EOM are normal. Pupils are equal, round, and reactive to light. Right eye exhibits no discharge. Left eye exhibits no discharge. No scleral icterus.  Neck: Normal range of motion. Neck supple. No JVD present. Carotid bruit is not present. No thyromegaly present.  Cardiovascular: Normal rate, regular rhythm, normal heart sounds and intact distal pulses.  Exam reveals no gallop.   Pulmonary/Chest: Effort normal and breath sounds normal. No respiratory distress. She has no wheezes. She exhibits no tenderness.  Abdominal: Soft. Bowel sounds are normal. She exhibits no distension, no abdominal bruit and no mass. There is no hepatosplenomegaly. There is no tenderness. There is no  rigidity, no rebound, no guarding, no tenderness at McBurney's point and negative Murphy's sign.  Musculoskeletal: She exhibits no edema.  No acute joint changes  Lymphadenopathy:    She has no cervical adenopathy.  Neurological: She is alert. She has normal reflexes. No cranial nerve deficit. She exhibits normal muscle tone. Coordination normal.  Skin: Skin is warm and dry. No rash noted. No erythema. No pallor.  Psychiatric: She has a normal mood and affect.           Assessment & Plan:

## 2013-07-29 NOTE — Telephone Encounter (Signed)
No problem.

## 2013-07-30 LAB — GIARDIA/CRYPTOSPORIDIUM (EIA)
Cryptosporidium Screen (EIA): NEGATIVE
Giardia Screen (EIA): NEGATIVE

## 2014-03-24 IMAGING — CT CT ABD-PELV W/O CM
2 of 4 series · 17 of 46 positions shown, 19 images · non-contrast
Comparison: CT of the chest 08/02/2012

CLINICAL DATA: Right lower quadrant pain radiating into the back.
Hematuria.  Recent esophagram.  Question of appendicitis versus
stone.

CT ABDOMEN AND PELVIS WITHOUT CONTRAST
TECHNIQUE: Multidetector CT imaging of the abdomen and pelvis was
performed following the standard protocol without intravenous
contrast.

[Series 2: under 200# stone no prev · axial · 0.55mm/px · z∈[-438,-62]mm · 14 of 81 slices shown, 16 images]
[im 3/81  soft-tissue]
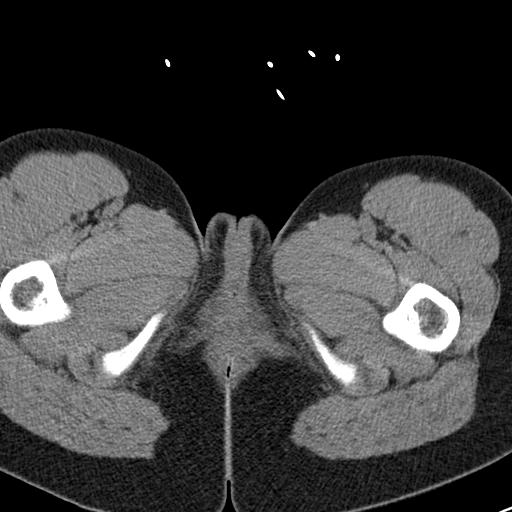
[im 3/81  bone]
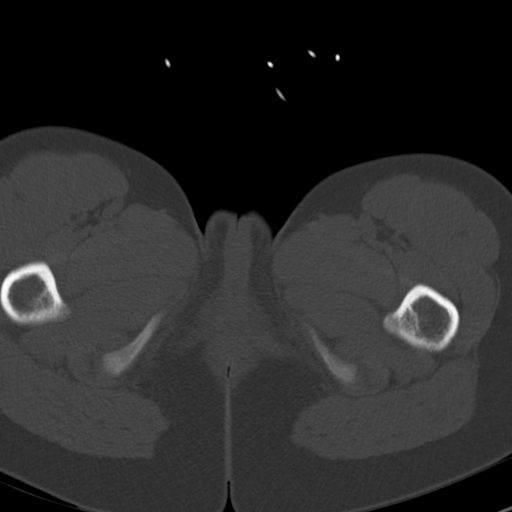
[im 9/81  soft-tissue]
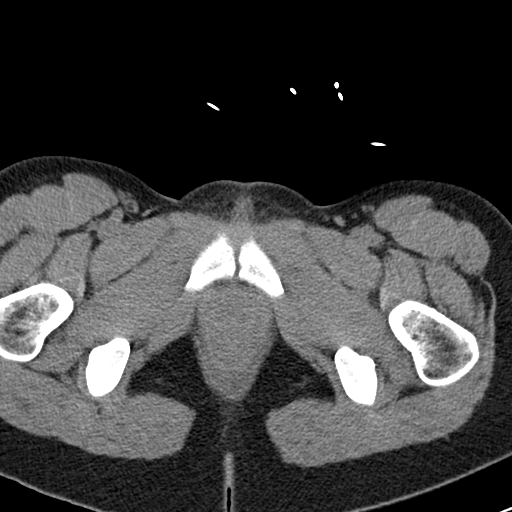
[im 15/81  soft-tissue]
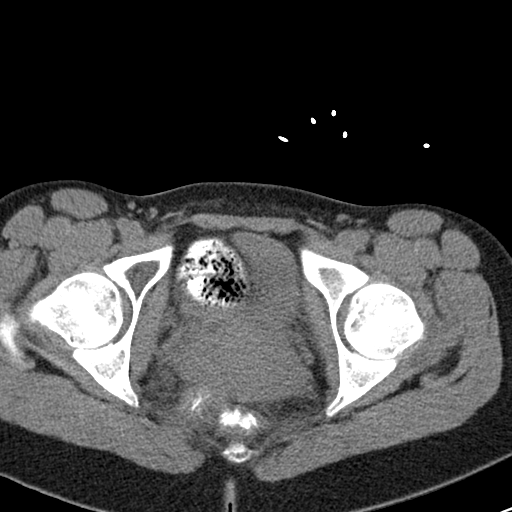
[im 21/81  soft-tissue]
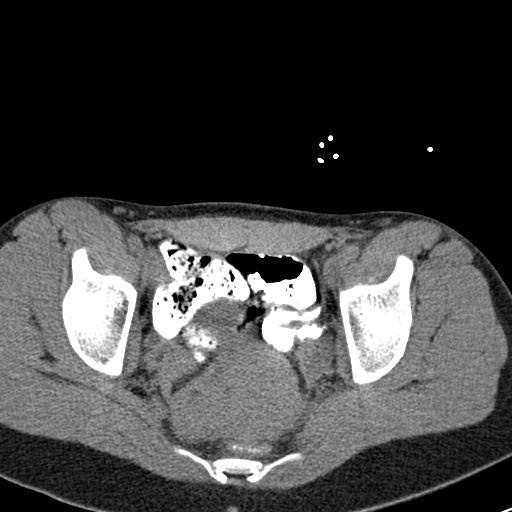
[im 27/81  soft-tissue]
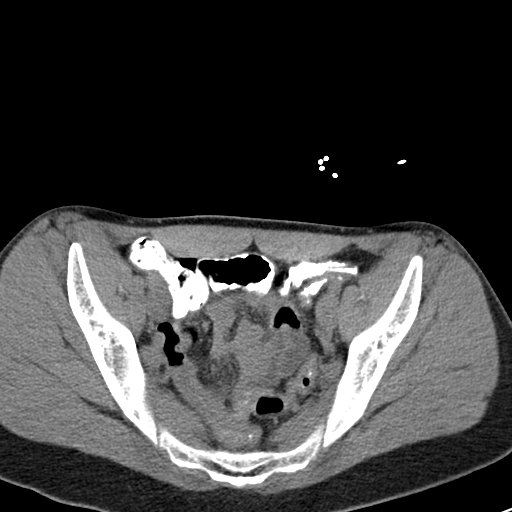
[im 33/81  soft-tissue]
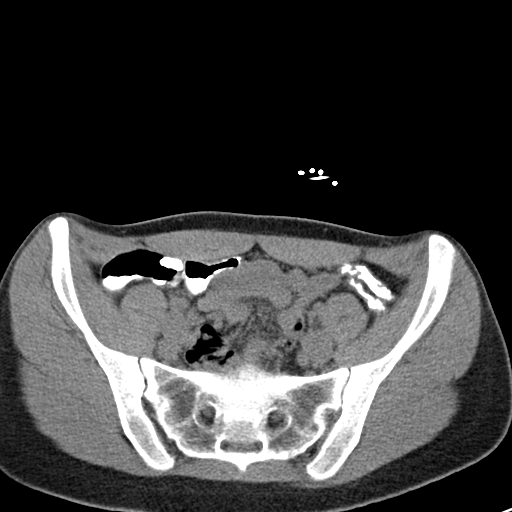
[im 39/81  soft-tissue]
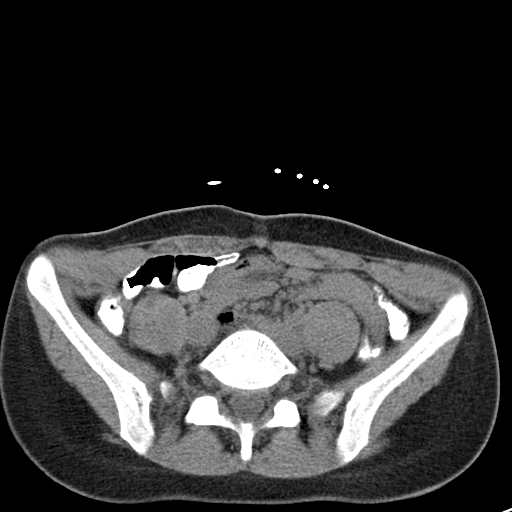
[im 42/81  soft-tissue]
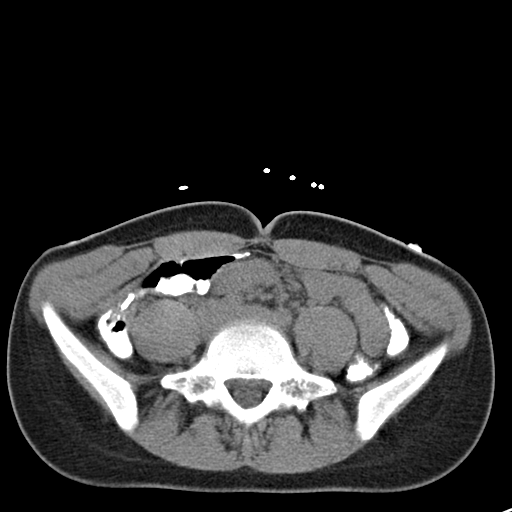
[im 48/81  soft-tissue]
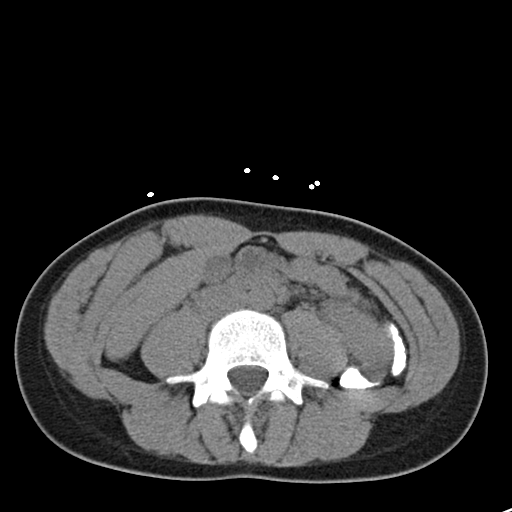
[im 48/81  bone]
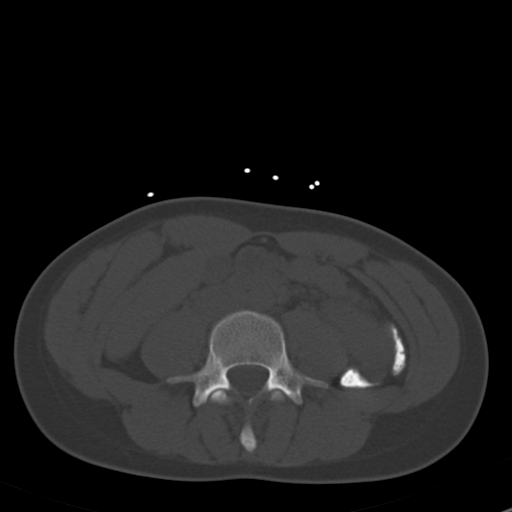
[im 54/81  soft-tissue]
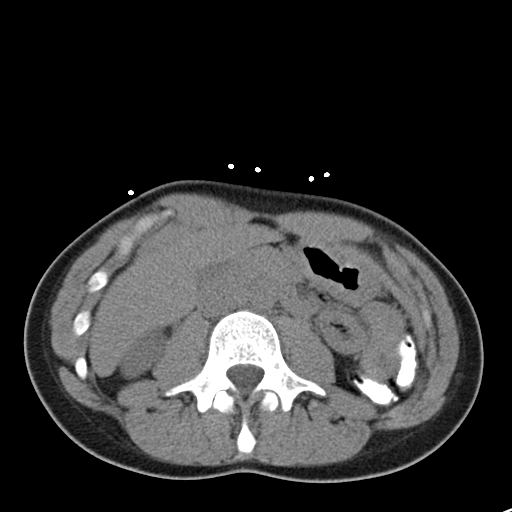
[im 60/81  soft-tissue]
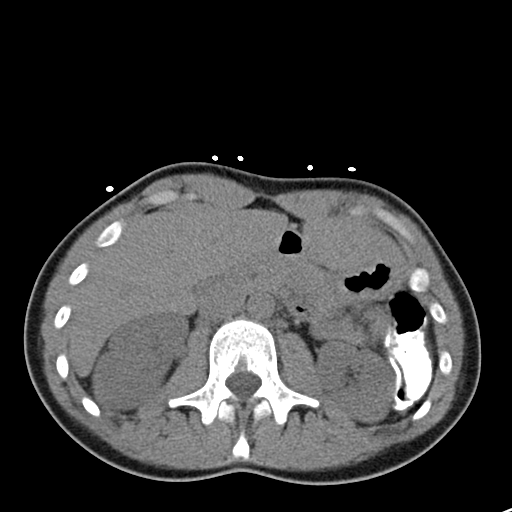
[im 66/81  soft-tissue]
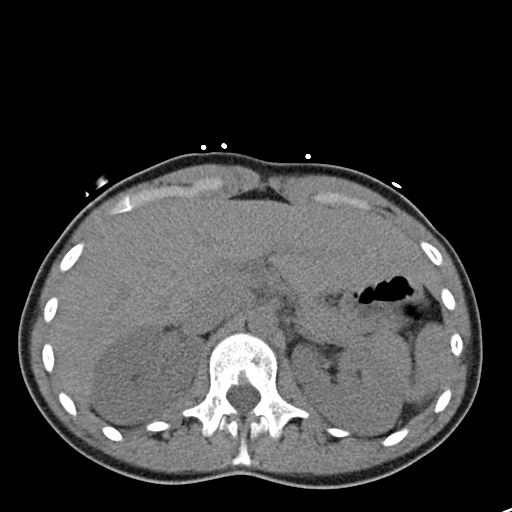
[im 72/81  soft-tissue]
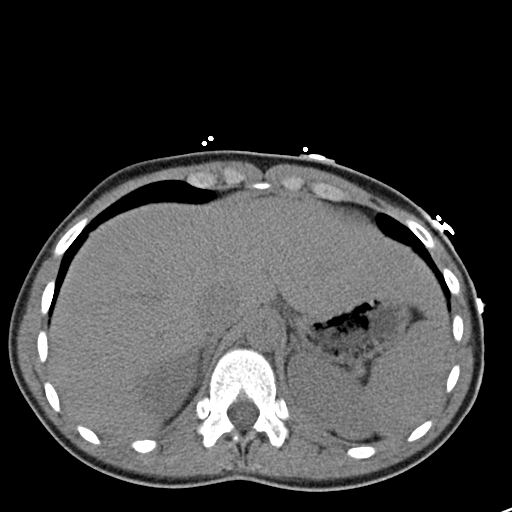
[im 78/81  soft-tissue]
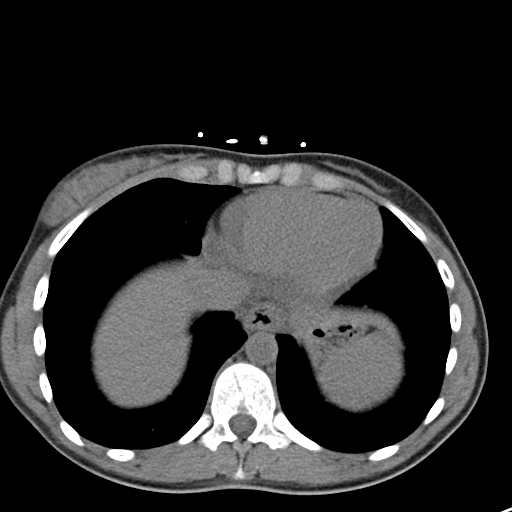

[Series 602: <mpr thick range> · coronal · 0.79mm/px · 3 of 68 slices shown]
[im 23/68  soft-tissue]
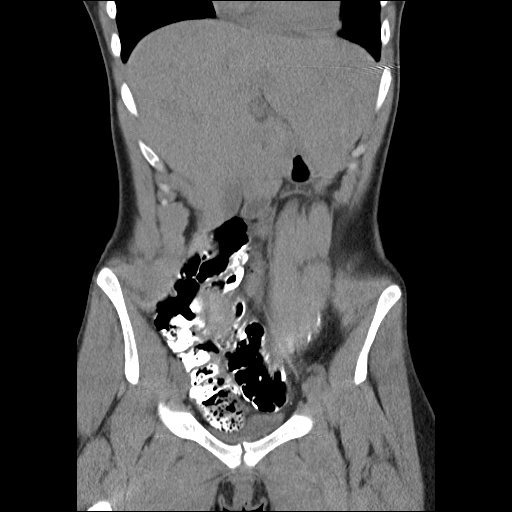
[im 30/68  soft-tissue]
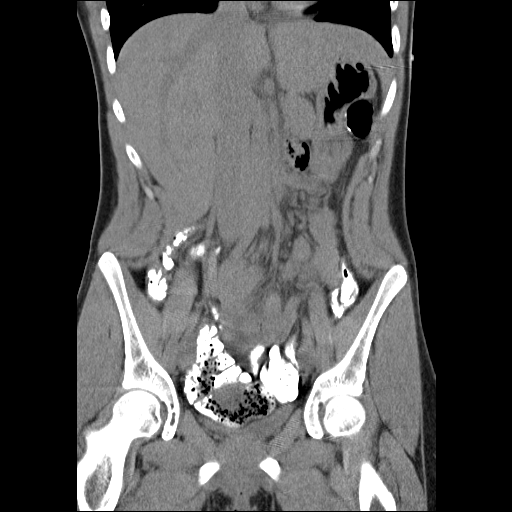
[im 38/68  soft-tissue]
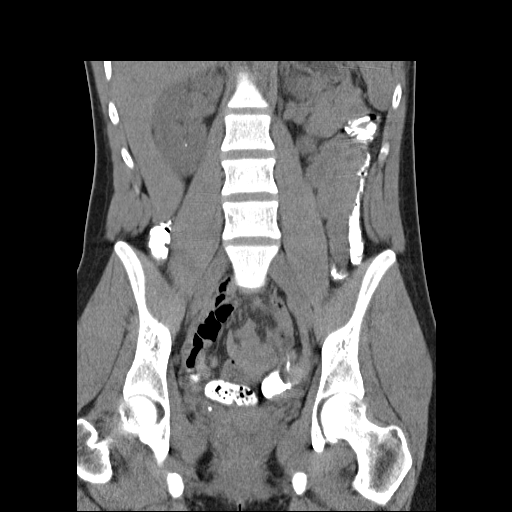

[17 of 46 positions shown; findings below may reference images not displayed]

FINDINGS: Images of the lung bases are unremarkable.  The no
evidence for pneumomediastinum at the lung bases.

No focal abnormality identified within the liver, spleen, pancreas,
adrenal glands.  There is mild right hydronephrosis and the density
of the right kidney is less than that of the left.  Intrarenal
calculus in the lower pole of the right kidney is 1 mm.  Although
the ureters are difficult to follow, a probable right ureteral
vesicle junction calculus is 3 mm in diameter.

The colon contains residual contrast following the recent
esophagram.  The appendix is opacified and normal in caliber.  The
stomach and small bowel loops are normal in appearance.  No
evidence for colonic obstruction.

The uterus is present.  No evidence for adnexal mass.  There may be
a trace amount free pelvic fluid.  No evidence for pelvic
adenopathy. No evidence for aortic aneurysm. Visualized osseous
structures have a normal appearance.
IMPRESSION: 1.  Probable 3 mm right ureteral vesicle junction calculus.
2.  Mild right hydronephrosis.
3.  Normal appendix.

## 2014-03-24 IMAGING — CR DG CHEST 2V
2 series · 2 of 2 positions shown · non-contrast
Comparison: 08/02/2012

CLINICAL DATA: Follow up of pneumomediastinum.

CHEST - 2 VIEW

[w chest pa]
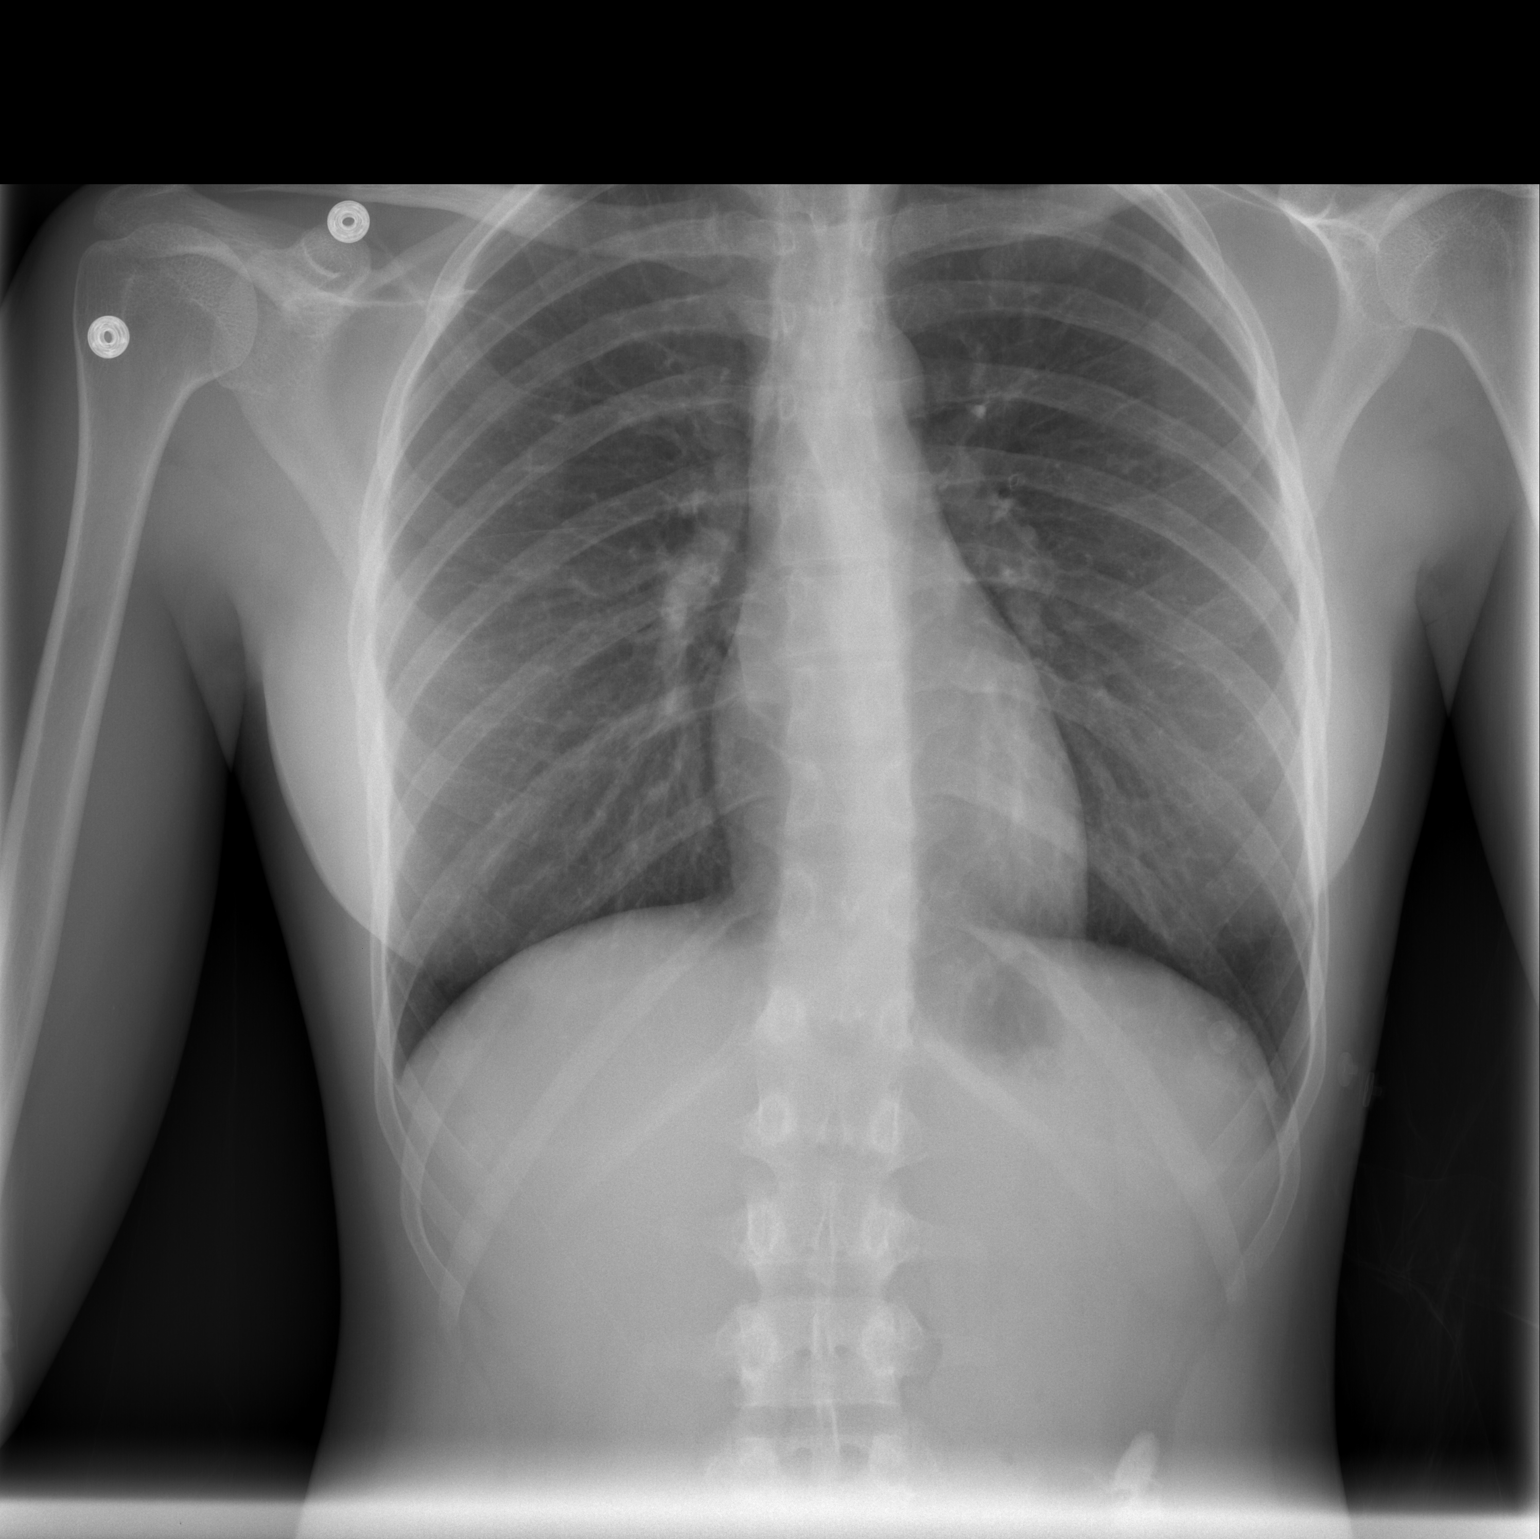

[w chest lat]
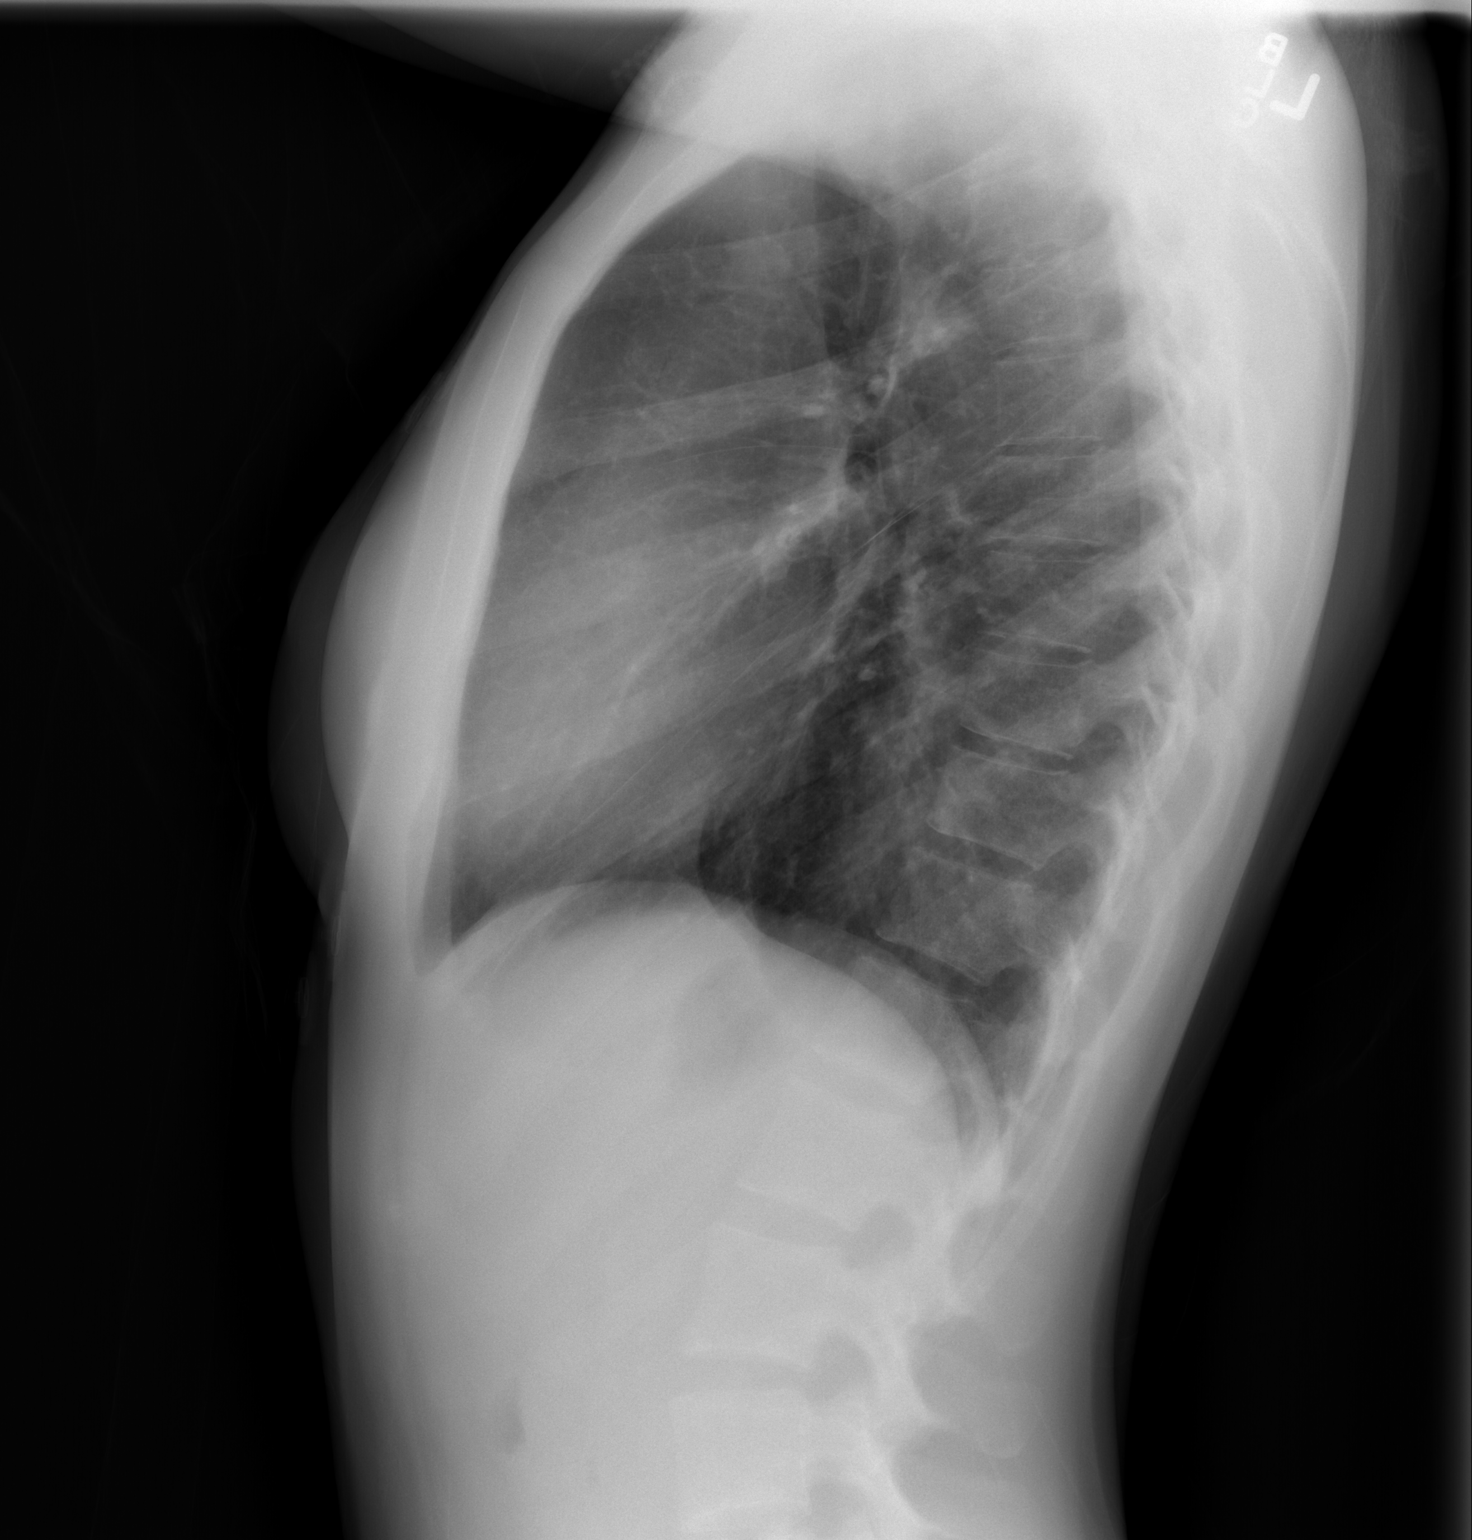

[2 of 2 positions shown; findings below may reference images not displayed]

FINDINGS: Subtle pneumomediastinum within the anterior chest on the
lateral view is improved since the prior plain film.  Not readily
apparent on the frontal radiograph.  No subcutaneous air within the
neck. Midline trachea.  Normal heart size.  No pleural effusion or
pneumothorax.  Clear lungs.

Minimal S-shaped spinal curvature.
IMPRESSION: Improvement in subtle pneumomediastinum.  No new process.

## 2019-07-11 ENCOUNTER — Other Ambulatory Visit: Payer: Self-pay

## 2019-07-11 DIAGNOSIS — Z20822 Contact with and (suspected) exposure to covid-19: Secondary | ICD-10-CM

## 2019-07-14 LAB — NOVEL CORONAVIRUS, NAA: SARS-CoV-2, NAA: NOT DETECTED

## 2019-08-04 ENCOUNTER — Ambulatory Visit: Payer: Managed Care, Other (non HMO) | Admitting: Podiatry

## 2019-08-04 ENCOUNTER — Telehealth: Payer: Self-pay | Admitting: *Deleted

## 2019-08-04 ENCOUNTER — Encounter: Payer: Self-pay | Admitting: Podiatry

## 2019-08-04 ENCOUNTER — Ambulatory Visit (INDEPENDENT_AMBULATORY_CARE_PROVIDER_SITE_OTHER): Payer: Managed Care, Other (non HMO)

## 2019-08-04 ENCOUNTER — Other Ambulatory Visit: Payer: Self-pay

## 2019-08-04 VITALS — BP 110/65 | Temp 97.2°F

## 2019-08-04 DIAGNOSIS — M2011 Hallux valgus (acquired), right foot: Secondary | ICD-10-CM

## 2019-08-04 DIAGNOSIS — M2012 Hallux valgus (acquired), left foot: Secondary | ICD-10-CM | POA: Diagnosis not present

## 2019-08-04 NOTE — Telephone Encounter (Signed)
"  I just finished an appointment and scheduled a surgery date with Dr. Amalia Hailey.  I think the surgery date was November 12.  I just want to call and confirm that the date is correct and get some information on insurance and cost.  Call me back when you're available."    Liane Comber Bunionectomy B/L)

## 2019-08-04 NOTE — Patient Instructions (Signed)
Pre-Operative Instructions  Congratulations, you have decided to take an important step towards improving your quality of life.  You can be assured that the doctors and staff at [facility] will be with you every step of the way.  Here are some important things you should know:  1. Plan to be at the surgery center/hospital at least 1 (one) hour prior to your scheduled time, unless otherwise directed by the surgical center/hospital staff.  You must have a responsible adult accompany you, remain during the surgery and drive you home.  Make sure you have directions to the surgical center/hospital to ensure you arrive on time. 2. If you are having surgery at [facility] or [facility] hospitals, you will need a copy of your medical history and physical form from your family physician within one month prior to the date of surgery. We will give you a form for your primary physician to complete.  3. We make every effort to accommodate the date you request for surgery.  However, there are times where surgery dates or times have to be moved.  We will contact you as soon as possible if a change in schedule is required.   4. No aspirin/ibuprofen for one week before surgery.  If you are on aspirin, any non-steroidal anti-inflammatory medications (Mobic, Aleve, Ibuprofen) should not be taken seven (7) days prior to your surgery.  You make take Tylenol for pain prior to surgery.  5. Medications - If you are taking daily heart and blood pressure medications, seizure, reflux, allergy, asthma, anxiety, pain or diabetes medications, make sure you notify the surgery center/hospital before the day of surgery so they can tell you which medications you should take or avoid the day of surgery. 6. No food or drink after midnight the night before surgery unless directed otherwise by surgical center/hospital staff. 7. No alcoholic beverages 24-hours prior to surgery.  No smoking 24-hours prior or 24-hours after  surgery. 8. Wear loose pants or shorts. They should be loose enough to fit over bandages, boots, and casts. 9. Don't wear slip-on shoes. Sneakers are preferred. 10. Bring your boot with you to the surgery center/hospital.  Also bring crutches or a walker if your physician has prescribed it for you.  If you do not have this equipment, it will be provided for you after surgery. 11. If you have not been contacted by the surgery center/hospital by the day before your surgery, call to confirm the date and time of your surgery. 12. Leave-time from work may vary depending on the type of surgery you have.  Appropriate arrangements should be made prior to surgery with your employer. 13. Prescriptions will be provided immediately following surgery by your doctor.  Fill these as soon as possible after surgery and take the medication as directed. Pain medications will not be refilled on weekends and must be approved by the doctor. 14. Remove nail polish on the operative foot and avoid getting pedicures prior to surgery. 15. Wash the night before surgery.  The night before surgery wash the foot and leg well with water and the antibacterial soap provided. Be sure to pay special attention to beneath the toenails and in between the toes.  Wash for at least three (3) minutes. Rinse thoroughly with water and dry well with a towel.  Perform this wash unless told not to do so by your physician.  Enclosed: 1 Ice pack (please put in freezer the night before surgery)   1 Hibiclens skin cleaner     Pre-op instructions  If you have any questions regarding the instructions, please do not hesitate to call our office.  [place]: [address], [place], [place] [address] -- [phone]  [place]: [address] [place], [place] [address] -- [phone]  [place]: [address]  [place], [place] [address] -- [phone]  [place]: [address], [place], [place] [address] -- [phone]  Website:  [email or web address] 

## 2019-08-06 NOTE — Progress Notes (Signed)
   Subjective: 28 y.o. female presents today as a new patient with a chief complaint of intermittent throbbing pain to the medial aspects of the bilateral feet secondary to a bunion deformity that has been present for the past several years. Running increases the pain. She has been taking OTC pain medication and wearing good shoes for treatment. Patient is here for further evaluation and treatment.   No past medical history on file.    Objective: Physical Exam General: The patient is alert and oriented x3 in no acute distress.  Dermatology: Skin is cool, dry and supple bilateral lower extremities. Negative for open lesions or macerations.  Vascular: Palpable pedal pulses bilaterally. No edema or erythema noted. Capillary refill within normal limits.  Neurological: Epicritic and protective threshold grossly intact bilaterally.   Musculoskeletal Exam: Clinical evidence of bunion deformity noted to the respective foot. There is moderate pain on palpation range of motion of the first MPJ. Lateral deviation of the hallux noted consistent with hallux abductovalgus.  Radiographic Exam: Increased intermetatarsal angle greater than 15 with a hallux abductus angle greater than 30 noted on AP view. Moderate degenerative changes noted within the first MPJ.  Assessment: 1. HAV w/ bunion deformity bilateral     Plan of Care:  1. Patient was evaluated. X-Rays reviewed. 2. Today we discussed the conservative versus surgical management of the presenting pathology. The patient opts for surgical management. All possible complications and details of the procedure were explained. All patient questions were answered. No guarantees were expressed or implied. 3. Authorization for surgery was initiated today. Surgery will consist of bunionectomy with osteotomy bilateral.  4. Patient lives in [place] and will be working from home during the holidays, staying with her parents.  5. Return to clinic one week post op.     Works for a [employer].      Edrick Kins, DPM [facility]  Dr. Edrick Kins, [address]                                        [place], [place] [address]                Office [phone]  Fax [phone]

## 2019-08-06 NOTE — Telephone Encounter (Signed)
I left her a message that we have her scheduled for October 30, 2019.  I informed her that she would receive a call from Jocelyn Lamer in our insurance department in regards to an estimate.

## 2019-08-11 ENCOUNTER — Other Ambulatory Visit: Payer: Self-pay

## 2019-08-11 DIAGNOSIS — Z23 Encounter for immunization: Secondary | ICD-10-CM

## 2019-08-12 LAB — NOVEL CORONAVIRUS, NAA: SARS-CoV-2, NAA: NOT DETECTED

## 2019-08-12 LAB — SPECIMEN STATUS REPORT

## 2019-10-13 ENCOUNTER — Telehealth: Payer: Self-pay | Admitting: *Deleted

## 2019-10-13 NOTE — Telephone Encounter (Signed)
"  I'm scheduled to have surgery on November 12.  I'd like to know how much I will have to pay.  I would also like to know if I'm going to need a Covid test done prior to having my surgery.  I'll be traveling to [place] so I want to make sure I am back in time to have it done before my surgery date."  A Covid test is required if you are going to intubated.  They may require it since you are traveling.  Call the surgical center and ask if you will need a test just to be safe.  As far as the estimate, I will ask Jocelyn Lamer in our insurance department to give you a call once she does the calculations.  "Okay, thank you so much.  I'll call the surgery center and inquire about the Covid test.  Liane Comber Bunionectomy B/L)

## 2019-10-27 ENCOUNTER — Telehealth: Payer: Self-pay | Admitting: *Deleted

## 2019-10-27 NOTE — Telephone Encounter (Addendum)
DOS 10/30/2019 [place] BUNIONECTOMY B/L FOOT - 03009  [employer]: Eligibility Begin Date: 07/23/2017 - Plan End Date: 12/18/2019  Co-Insurance:20%  Deductible:$1,000 per Calendar Year Individual  $1,000 Remaining Individual   Out-Of-Pocket Max: $3,000 per Calendar Year Individual  $2,920 Remaining Individual    Pre-certification is NOT REQUIRED - [id number]  CONFIRMATION # [id number]

## 2019-10-30 ENCOUNTER — Other Ambulatory Visit: Payer: Self-pay | Admitting: Podiatry

## 2019-10-30 DIAGNOSIS — M2011 Hallux valgus (acquired), right foot: Secondary | ICD-10-CM

## 2019-10-30 DIAGNOSIS — M2012 Hallux valgus (acquired), left foot: Secondary | ICD-10-CM | POA: Diagnosis not present

## 2019-10-30 MED ORDER — HYDROCODONE-ACETAMINOPHEN 10-325 MG PO TABS: 1.0000 | ORAL_TABLET | ORAL | 0 refills | Status: AC | PRN

## 2019-10-30 MED ORDER — ONDANSETRON HCL 4 MG PO TABS: 4.0000 mg | ORAL_TABLET | Freq: Three times a day (TID) | ORAL | 0 refills | Status: AC | PRN

## 2019-10-30 NOTE — Progress Notes (Signed)
PRN postop 

## 2019-11-01 ENCOUNTER — Other Ambulatory Visit: Payer: Self-pay | Admitting: Podiatry

## 2019-11-01 MED ORDER — PROMETHAZINE HCL 25 MG PO TABS: 25.0000 mg | ORAL_TABLET | Freq: Four times a day (QID) | ORAL | 0 refills | Status: AC | PRN

## 2019-11-01 NOTE — Progress Notes (Signed)
Patient called stating that the zofran is not helping with nausea and wanted to try something different. Discussed changing pain medications but all seem to upset her stomach. Ordered phenergan 25mg  q6h prn.

## 2019-11-02 ENCOUNTER — Other Ambulatory Visit: Payer: Self-pay | Admitting: Podiatry

## 2019-11-02 MED ORDER — IBUPROFEN 800 MG PO TABS: 800.0000 mg | ORAL_TABLET | Freq: Three times a day (TID) | ORAL | 0 refills | Status: AC | PRN

## 2019-11-02 NOTE — Progress Notes (Signed)
Patient called stating that the phenergan was not helping much and still getting nauseous. Due to this she stopped and is only taking tylenol. She is asking for a prescription. I sen in ibuprofen 800mg  TID prn. She is doing well otherwise. No further questions or concerns. Encouraged to call back with any additional questions or concerns.

## 2019-11-05 ENCOUNTER — Ambulatory Visit (INDEPENDENT_AMBULATORY_CARE_PROVIDER_SITE_OTHER): Payer: Managed Care, Other (non HMO)

## 2019-11-05 ENCOUNTER — Other Ambulatory Visit: Payer: Self-pay

## 2019-11-05 ENCOUNTER — Ambulatory Visit (INDEPENDENT_AMBULATORY_CARE_PROVIDER_SITE_OTHER): Payer: Managed Care, Other (non HMO) | Admitting: Podiatry

## 2019-11-05 DIAGNOSIS — Z9889 Other specified postprocedural states: Secondary | ICD-10-CM

## 2019-11-05 DIAGNOSIS — M2012 Hallux valgus (acquired), left foot: Secondary | ICD-10-CM

## 2019-11-05 DIAGNOSIS — M2011 Hallux valgus (acquired), right foot: Secondary | ICD-10-CM

## 2019-11-15 NOTE — Progress Notes (Signed)
   Subjective:  Patient presents today status post bunionectomy bilateral. DOS: 10/30/2019. She states she is doing well. She reports some mild pain that began last night. She was taking Vicodin but discontinued secondary to nausea. There are no modifying factors noted. Patient is here for further evaluation and treatment.    No past medical history on file.    Objective/Physical Exam Neurovascular status intact.  Skin incisions appear to be well coapted with sutures and staples intact. No sign of infectious process noted. No dehiscence. No active bleeding noted. Moderate edema noted to the surgical extremity.  Radiographic Exam:  Orthopedic hardware and osteotomies sites appear to be stable with routine healing.  Assessment: 1. s/p bunionectomy bilateral. DOS: 10/30/2019   Plan of Care:  1. Patient was evaluated. X-rays reviewed 2. Dressing changed.  3. Continue weightbearing in post op shoes.  4. Return to clinic in one week.    Edrick Kins, DPM [facility]  Dr. Edrick Kins, [place]                                        [place], [place] [address]                Office [phone]  Fax [phone]

## 2019-11-17 ENCOUNTER — Ambulatory Visit (INDEPENDENT_AMBULATORY_CARE_PROVIDER_SITE_OTHER): Payer: Managed Care, Other (non HMO) | Admitting: Podiatry

## 2019-11-17 ENCOUNTER — Other Ambulatory Visit: Payer: Self-pay

## 2019-11-17 DIAGNOSIS — M2011 Hallux valgus (acquired), right foot: Secondary | ICD-10-CM

## 2019-11-17 DIAGNOSIS — Z9889 Other specified postprocedural states: Secondary | ICD-10-CM

## 2019-11-17 DIAGNOSIS — M2012 Hallux valgus (acquired), left foot: Secondary | ICD-10-CM

## 2019-11-20 ENCOUNTER — Encounter: Payer: Self-pay | Admitting: Podiatry

## 2019-11-20 NOTE — Progress Notes (Signed)
   Subjective:  Patient presents today status post bunionectomy bilateral. DOS: 10/30/2019. She states she is doing well and healing appropriately. She reports some mild tenderness of the left foot with some associated clear drainage. She has been using the post op shoes as directed. She denies any known aggravating factors. Patient is here for further evaluation and treatment.    No past medical history on file.    Objective/Physical Exam Neurovascular status intact.  Skin incisions appear to be well coapted with sutures and staples intact. No sign of infectious process noted. No dehiscence. No active bleeding noted. Moderate edema noted to the surgical extremity.  Assessment: 1. s/p bunionectomy bilateral. DOS: 10/30/2019   Plan of Care:  1. Patient was evaluated. 2. Sutures removed.  3. Continue using post op shoes bilaterally.  4. Return to clinic in 2 weeks.    Edrick Kins, DPM [facility]  Dr. Edrick Kins, [place]                                        [place], [place] [address]                Office [phone]  Fax [phone]

## 2019-11-21 ENCOUNTER — Other Ambulatory Visit: Payer: Self-pay | Admitting: Podiatry

## 2019-11-21 ENCOUNTER — Telehealth: Payer: Self-pay | Admitting: *Deleted

## 2019-11-21 MED ORDER — DOXYCYCLINE HYCLATE 100 MG PO TABS: 100.0000 mg | ORAL_TABLET | Freq: Two times a day (BID) | ORAL | 0 refills | Status: AC

## 2019-11-21 NOTE — Telephone Encounter (Signed)
She could see Dr. Amalia Hailey  Back next week. I do not think it is concerning.   Thanks  Lennette Bihari

## 2019-11-21 NOTE — Telephone Encounter (Signed)
Spoke with patient regarding image. Rx doxycycline 100mg  sent to pharmacy. Instructed to call if it worsens. - Dr. Amalia Hailey

## 2019-11-21 NOTE — Telephone Encounter (Signed)
Patient is calling back concerning  A message left earlier about a blister on left foot near surgical incision , hurts to touch. Should she be concerned?

## 2019-11-25 ENCOUNTER — Encounter: Payer: Self-pay | Admitting: Podiatry

## 2019-12-01 ENCOUNTER — Ambulatory Visit (INDEPENDENT_AMBULATORY_CARE_PROVIDER_SITE_OTHER): Payer: Managed Care, Other (non HMO)

## 2019-12-01 ENCOUNTER — Ambulatory Visit (INDEPENDENT_AMBULATORY_CARE_PROVIDER_SITE_OTHER): Payer: Managed Care, Other (non HMO) | Admitting: Podiatry

## 2019-12-01 ENCOUNTER — Other Ambulatory Visit: Payer: Self-pay

## 2019-12-01 DIAGNOSIS — M2012 Hallux valgus (acquired), left foot: Secondary | ICD-10-CM

## 2019-12-01 DIAGNOSIS — M2011 Hallux valgus (acquired), right foot: Secondary | ICD-10-CM

## 2019-12-01 DIAGNOSIS — Z9889 Other specified postprocedural states: Secondary | ICD-10-CM

## 2019-12-03 NOTE — Progress Notes (Signed)
   Subjective:  Patient presents today status post bunionectomy bilateral. DOS: 10/30/2019. She states she is doing well. She reports only mild intermittent pain. She denies any modifying factors. She has been using the post op shoes as directed. Patient is here for further evaluation and treatment.    No past medical history on file.    Objective/Physical Exam Neurovascular status intact.  Skin incisions appear to be well coapted. No sign of infectious process noted. No dehiscence. No active bleeding noted. Moderate edema noted to the surgical extremity.  Radiographic Exam:  Orthopedic hardware and osteotomies sites appear to be stable with routine healing.   Assessment: 1. s/p bunionectomy bilateral. DOS: 10/30/2019   Plan of Care:  1. Patient was evaluated. X-Rays reviewed.  2. Sutures removed.  3. Discontinue using post op shoes.  4. Recommended good sneakers.  5. Return to clinic in 6 weeks for final follow up visit.     Edrick Kins, DPM [facility]  Dr. Edrick Kins, [place]                                        [place],  [address]                Office [phone]  Fax [phone]

## 2019-12-18 ENCOUNTER — Ambulatory Visit: Payer: Managed Care, Other (non HMO) | Attending: Internal Medicine

## 2019-12-18 DIAGNOSIS — Z20822 Contact with and (suspected) exposure to covid-19: Secondary | ICD-10-CM

## 2019-12-20 LAB — NOVEL CORONAVIRUS, NAA: SARS-CoV-2, NAA: NOT DETECTED

## 2020-01-12 ENCOUNTER — Encounter: Payer: Self-pay | Admitting: Podiatry

## 2020-12-06 ENCOUNTER — Other Ambulatory Visit: Payer: Managed Care, Other (non HMO)
